# Patient Record
Sex: Male | Born: 1975 | Race: Black or African American | Hispanic: No | Smoking: Current every day smoker
Health system: Southern US, Community
[De-identification: ages and names within clinical notes are randomized; demographics above are authoritative.]

## PROBLEM LIST (undated history)

## (undated) DIAGNOSIS — F418 Other specified anxiety disorders: Secondary | ICD-10-CM

## (undated) DIAGNOSIS — M549 Dorsalgia, unspecified: Secondary | ICD-10-CM

## (undated) DIAGNOSIS — F4310 Post-traumatic stress disorder, unspecified: Secondary | ICD-10-CM

## (undated) DIAGNOSIS — R569 Unspecified convulsions: Secondary | ICD-10-CM

## (undated) DIAGNOSIS — L309 Dermatitis, unspecified: Secondary | ICD-10-CM

## (undated) DIAGNOSIS — I10 Essential (primary) hypertension: Secondary | ICD-10-CM

## (undated) HISTORY — PX: KNEE SURGERY: SHX244

---

## 2012-11-14 ENCOUNTER — Emergency Department (HOSPITAL_BASED_OUTPATIENT_CLINIC_OR_DEPARTMENT_OTHER)
Admission: EM | Admit: 2012-11-14 | Discharge: 2012-11-14 | Disposition: A | Discharge status: Home / Self Care | Payer: Self-pay | Attending: Emergency Medicine | Admitting: Emergency Medicine

## 2012-11-14 ENCOUNTER — Encounter (HOSPITAL_BASED_OUTPATIENT_CLINIC_OR_DEPARTMENT_OTHER): Payer: Self-pay | Admitting: *Deleted

## 2012-11-14 DIAGNOSIS — F17200 Nicotine dependence, unspecified, uncomplicated: Secondary | ICD-10-CM | POA: Insufficient documentation

## 2012-11-14 DIAGNOSIS — IMO0002 Reserved for concepts with insufficient information to code with codable children: Secondary | ICD-10-CM | POA: Insufficient documentation

## 2012-11-14 DIAGNOSIS — Y939 Activity, unspecified: Secondary | ICD-10-CM | POA: Insufficient documentation

## 2012-11-14 DIAGNOSIS — Y929 Unspecified place or not applicable: Secondary | ICD-10-CM | POA: Insufficient documentation
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
## 2012-11-17 NOTE — ED Provider Notes (Signed)
Medical screening examination/treatment/procedure(s) were performed by non-physician practitioner and as supervising physician I was immediately available for consultation/collaboration.   Shelda Jakes, MD 11/17/12 [phone]

## 2013-10-11 ENCOUNTER — Encounter (HOSPITAL_BASED_OUTPATIENT_CLINIC_OR_DEPARTMENT_OTHER): Payer: Self-pay | Admitting: Emergency Medicine

## 2013-10-11 ENCOUNTER — Emergency Department (HOSPITAL_BASED_OUTPATIENT_CLINIC_OR_DEPARTMENT_OTHER): Payer: Self-pay

## 2013-10-11 ENCOUNTER — Emergency Department (HOSPITAL_BASED_OUTPATIENT_CLINIC_OR_DEPARTMENT_OTHER)
Admission: EM | Admit: 2013-10-11 | Discharge: 2013-10-11 | Disposition: A | Discharge status: Home / Self Care | Payer: Self-pay | Attending: Emergency Medicine | Admitting: Emergency Medicine

## 2013-10-11 DIAGNOSIS — M545 Low back pain, unspecified: Secondary | ICD-10-CM | POA: Insufficient documentation

## 2013-10-11 DIAGNOSIS — R292 Abnormal reflex: Secondary | ICD-10-CM | POA: Insufficient documentation

## 2013-10-11 DIAGNOSIS — R509 Fever, unspecified: Secondary | ICD-10-CM | POA: Insufficient documentation

## 2013-10-11 DIAGNOSIS — R109 Unspecified abdominal pain: Secondary | ICD-10-CM | POA: Insufficient documentation

## 2013-10-11 DIAGNOSIS — R112 Nausea with vomiting, unspecified: Secondary | ICD-10-CM | POA: Insufficient documentation

## 2013-10-11 DIAGNOSIS — K5900 Constipation, unspecified: Secondary | ICD-10-CM | POA: Insufficient documentation

## 2013-10-11 DIAGNOSIS — R350 Frequency of micturition: Secondary | ICD-10-CM | POA: Insufficient documentation

## 2013-10-11 DIAGNOSIS — G8911 Acute pain due to trauma: Secondary | ICD-10-CM | POA: Insufficient documentation

## 2013-10-11 DIAGNOSIS — S39012A Strain of muscle, fascia and tendon of lower back, initial encounter: Secondary | ICD-10-CM

## 2013-10-11 DIAGNOSIS — R3915 Urgency of urination: Secondary | ICD-10-CM | POA: Insufficient documentation

## 2013-10-11 DIAGNOSIS — M6281 Muscle weakness (generalized): Secondary | ICD-10-CM | POA: Insufficient documentation

## 2013-10-11 DIAGNOSIS — R259 Unspecified abnormal involuntary movements: Secondary | ICD-10-CM | POA: Insufficient documentation

## 2013-10-11 DIAGNOSIS — F17200 Nicotine dependence, unspecified, uncomplicated: Secondary | ICD-10-CM | POA: Insufficient documentation

## 2013-10-11 DIAGNOSIS — R197 Diarrhea, unspecified: Secondary | ICD-10-CM | POA: Insufficient documentation

## 2013-10-11 MED ORDER — NAPROXEN 500 MG PO TABS: 500.0000 mg | ORAL_TABLET | Freq: Two times a day (BID) | ORAL | Status: DC

## 2013-10-11 MED ORDER — DIAZEPAM 5 MG PO TABS
5.0000 mg | ORAL_TABLET | Freq: Once | ORAL | Status: AC
  Administered 2013-10-11: 5 mg via ORAL
  Filled 2013-10-11: qty 1

## 2013-10-11 MED ORDER — KETOROLAC TROMETHAMINE 60 MG/2ML IM SOLN
60.0000 mg | Freq: Once | INTRAMUSCULAR | Status: AC
  Administered 2013-10-11: 60 mg via INTRAMUSCULAR
  Filled 2013-10-11: qty 2

## 2013-10-11 MED ORDER — DIAZEPAM 5 MG PO TABS: 5.0000 mg | ORAL_TABLET | Freq: Four times a day (QID) | ORAL | Status: DC | PRN

## 2013-10-11 NOTE — ED Notes (Signed)
Pt c/o chronic back pain x 1 year by injury increased pain this am
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
## 2013-10-11 NOTE — ED Notes (Signed)
Patient stated that he could not wait for an MRI and spoke with MD and was given a prescription for back muscle pain.  Patient given referral for MD for continued back pain

## 2013-10-16 ENCOUNTER — Ambulatory Visit (INDEPENDENT_AMBULATORY_CARE_PROVIDER_SITE_OTHER): Payer: Self-pay | Admitting: Family Medicine

## 2013-10-16 ENCOUNTER — Encounter: Payer: Self-pay | Admitting: Family Medicine

## 2013-10-16 VITALS — BP 145/102 | HR 112 | Ht 75.0 in | Wt 170.0 lb

## 2013-10-16 DIAGNOSIS — G8929 Other chronic pain: Secondary | ICD-10-CM

## 2013-10-16 DIAGNOSIS — M549 Dorsalgia, unspecified: Secondary | ICD-10-CM

## 2013-10-16 MED ORDER — PREDNISONE (PAK) 10 MG PO TABS: ORAL_TABLET | ORAL | Status: DC

## 2013-10-16 MED ORDER — TRAMADOL HCL 50 MG PO TABS: 50.0000 mg | ORAL_TABLET | Freq: Four times a day (QID) | ORAL | Status: DC | PRN

## 2013-10-16 MED ORDER — NAPROXEN 500 MG PO TABS: 500.0000 mg | ORAL_TABLET | Freq: Two times a day (BID) | ORAL | Status: DC

## 2013-10-16 MED ORDER — CYCLOBENZAPRINE HCL 10 MG PO TABS: 10.0000 mg | ORAL_TABLET | Freq: Three times a day (TID) | ORAL | Status: DC | PRN

## 2013-10-16 NOTE — Patient Instructions (Signed)
At minimum you have thoracic, cervical, lumbar strains. A prednisone dose pack is the best option for immediate relief and may be prescribed. Day after finishing prednisone start naproxen twice a day with food for pain and inflammation. Tramadol as needed for severe pain (no driving on this medicine). Flexeril as needed for muscle spasms (no driving on this medicine if it makes you sleepy). Stay as active as possible. Follow up with me as soon as you get cone coverage. Strengthening of low back muscles, abdominal musculature are key for long term pain relief. If not improving, will consider further imaging (MRI), physical therapy.

## 2013-10-19 ENCOUNTER — Encounter: Payer: Self-pay | Admitting: Family Medicine
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
## 2013-10-27 ENCOUNTER — Encounter: Payer: Self-pay | Admitting: *Deleted

## 2013-10-28 DIAGNOSIS — G8929 Other chronic pain: Secondary | ICD-10-CM | POA: Insufficient documentation

## 2013-10-28 NOTE — Assessment & Plan Note (Signed)
patient reports pain throughout entire back with unusual concurrent symptomatology (bilateral hand numbness, left arm jerking, left foot locking up, jaw tightness) that does not fit with any nerve distribution that could be caused by a disc herniation.  Suspect he has muscle strains of cervical through lumbar back based on exam.  Has not done physical therapy for this - advised to call us when cone coverage goes through andthis would be first step.  Prednisone dose pack then naproxen.  Tramadol, flexeril as needed.  No refills on the tramadol.  Can consider MRIs but distribution would not suggest this to be cause of his pain.

## 2015-04-07 ENCOUNTER — Encounter (HOSPITAL_BASED_OUTPATIENT_CLINIC_OR_DEPARTMENT_OTHER): Payer: Self-pay | Admitting: *Deleted

## 2015-04-07 ENCOUNTER — Emergency Department (HOSPITAL_BASED_OUTPATIENT_CLINIC_OR_DEPARTMENT_OTHER): Payer: Self-pay

## 2015-04-07 ENCOUNTER — Inpatient Hospital Stay (HOSPITAL_BASED_OUTPATIENT_CLINIC_OR_DEPARTMENT_OTHER)
Admission: EM | Admit: 2015-04-07 | Discharge: 2015-04-10 | DRG: 603 | Disposition: A | Discharge status: Home / Self Care | Payer: Self-pay | Attending: Internal Medicine | Admitting: Internal Medicine

## 2015-04-07 DIAGNOSIS — L309 Dermatitis, unspecified: Secondary | ICD-10-CM | POA: Diagnosis present

## 2015-04-07 DIAGNOSIS — R52 Pain, unspecified: Secondary | ICD-10-CM

## 2015-04-07 DIAGNOSIS — F17210 Nicotine dependence, cigarettes, uncomplicated: Secondary | ICD-10-CM | POA: Diagnosis present

## 2015-04-07 DIAGNOSIS — M7989 Other specified soft tissue disorders: Secondary | ICD-10-CM | POA: Diagnosis present

## 2015-04-07 DIAGNOSIS — R079 Chest pain, unspecified: Secondary | ICD-10-CM

## 2015-04-07 DIAGNOSIS — L0390 Cellulitis, unspecified: Secondary | ICD-10-CM | POA: Insufficient documentation

## 2015-04-07 DIAGNOSIS — Z79899 Other long term (current) drug therapy: Secondary | ICD-10-CM

## 2015-04-07 DIAGNOSIS — R609 Edema, unspecified: Secondary | ICD-10-CM

## 2015-04-07 DIAGNOSIS — R030 Elevated blood-pressure reading, without diagnosis of hypertension: Secondary | ICD-10-CM | POA: Diagnosis present

## 2015-04-07 DIAGNOSIS — L03114 Cellulitis of left upper limb: Principal | ICD-10-CM

## 2015-04-07 DIAGNOSIS — Z872 Personal history of diseases of the skin and subcutaneous tissue: Secondary | ICD-10-CM

## 2015-04-07 HISTORY — DX: Dorsalgia, unspecified: M54.9

## 2015-04-07 HISTORY — DX: Dermatitis, unspecified: L30.9

## 2015-04-07 LAB — CBC WITH DIFFERENTIAL/PLATELET
Basophils Absolute: 0 10*3/uL (ref 0.0–0.1)
Basophils Relative: 0 % (ref 0–1)
EOS ABS: 0.2 10*3/uL (ref 0.0–0.7)
EOS PCT: 3 % (ref 0–5)
HCT: 41.4 % (ref 39.0–52.0)
Hemoglobin: 13.5 g/dL (ref 13.0–17.0)
Lymphocytes Relative: 33 % (ref 12–46)
Lymphs Abs: 2.7 10*3/uL (ref 0.7–4.0)
MCH: 30.2 pg (ref 26.0–34.0)
MCHC: 32.6 g/dL (ref 30.0–36.0)
MCV: 92.6 fL (ref 78.0–100.0)
Monocytes Absolute: 0.7 10*3/uL (ref 0.1–1.0)
Monocytes Relative: 8 % (ref 3–12)
NEUTROS PCT: 56 % (ref 43–77)
Neutro Abs: 4.5 10*3/uL (ref 1.7–7.7)
Platelets: 315 10*3/uL (ref 150–400)
RBC: 4.47 MIL/uL (ref 4.22–5.81)
RDW: 13.8 % (ref 11.5–15.5)
WBC: 8.1 10*3/uL (ref 4.0–10.5)

## 2015-04-07 LAB — BASIC METABOLIC PANEL
ANION GAP: 9 (ref 5–15)
BUN: 6 mg/dL (ref 6–20)
CO2: 27 mmol/L (ref 22–32)
Calcium: 8.5 mg/dL — ABNORMAL LOW (ref 8.9–10.3)
Chloride: 103 mmol/L (ref 101–111)
Creatinine, Ser: 0.75 mg/dL (ref 0.61–1.24)
GFR calc Af Amer: >60 mL/min (ref >60)
GFR calc non Af Amer: >60 mL/min (ref >60)
GLUCOSE: 97 mg/dL (ref 65–99)
Potassium: 3.8 mmol/L (ref 3.5–5.1)
Sodium: 139 mmol/L (ref 135–145)

## 2015-04-07 LAB — PROTIME-INR
INR: 0.98 (ref 0.00–1.49)
PROTHROMBIN TIME: 13.2 s (ref 11.6–15.2)

## 2015-04-07 LAB — D-DIMER, QUANTITATIVE (NOT AT ARMC): D DIMER QUANT: 1.81 ug{FEU}/mL — AB (ref 0.00–0.48)

## 2015-04-07 LAB — I-STAT CG4 LACTIC ACID, ED: Lactic Acid, Venous: 1.37 mmol/L (ref 0.5–2.0)

## 2015-04-07 LAB — ETHANOL: Alcohol, Ethyl (B): 270 mg/dL — ABNORMAL HIGH (ref <5)

## 2015-04-07 MED ORDER — SODIUM CHLORIDE 0.9 % IV SOLN
Freq: Once | INTRAVENOUS | Status: AC
  Administered 2015-04-08: 03:00:00 via INTRAVENOUS

## 2015-04-07 MED ORDER — SODIUM CHLORIDE 0.9 % IV BOLUS (SEPSIS)
500.0000 mL | Freq: Once | INTRAVENOUS | Status: AC
  Administered 2015-04-07: 500 mL via INTRAVENOUS

## 2015-04-07 MED ORDER — ONDANSETRON HCL 4 MG/2ML IJ SOLN
4.0000 mg | Freq: Once | INTRAMUSCULAR | Status: AC
  Administered 2015-04-07: 4 mg via INTRAVENOUS
  Filled 2015-04-07: qty 2

## 2015-04-07 MED ORDER — VANCOMYCIN HCL IN DEXTROSE 1-5 GM/200ML-% IV SOLN
1000.0000 mg | Freq: Three times a day (TID) | INTRAVENOUS | Status: DC
  Administered 2015-04-07 – 2015-04-10 (×7): 1000 mg via INTRAVENOUS
  Filled 2015-04-07 (×11): qty 200

## 2015-04-07 MED ORDER — MORPHINE SULFATE 4 MG/ML IJ SOLN
4.0000 mg | INTRAMUSCULAR | Status: DC | PRN
  Administered 2015-04-07 – 2015-04-08 (×2): 4 mg via INTRAVENOUS
  Filled 2015-04-07 (×2): qty 1

## 2015-04-07 MED ORDER — IOHEXOL 350 MG/ML SOLN
100.0000 mL | Freq: Once | INTRAVENOUS | Status: AC | PRN
  Administered 2015-04-07: 100 mL via INTRAVENOUS

## 2015-04-07 NOTE — ED Notes (Signed)
C/o left arm swelling and painful x several days  Denies inj

## 2015-04-07 NOTE — ED Notes (Signed)
Pt c/o left arm pain and swelling x2-3 days. Pt also c/o right, middle back pain.

## 2015-04-07 NOTE — ED Provider Notes (Signed)
CSN: [id number]     Arrival date & time 04/07/15  1951 History  This chart was scribed for Rolland PorterMark Dorthula Bier, MD by Octavia HeirArianna Nassar, ED Scribe. This patient was seen in room MH01/MH01 and the patient's care was started at 8:10 PM.    Chief Complaint  Patient presents with  . Arm Pain     The history is provided by the patient. No language interpreter was used.    HPI Comments: Cory Reese is a 39 y.o. male who has a PMHx of eczema presents to the Emergency Department complaining of constant, gradual worsening left arm pain onset 3 days ago. He has associated pain in his left armpit. Pt reports his left arm is swollen and very painful. He notes the pain started in his hands then radiated up to his arm. Pt also notes having associated constant, gradual worsening chest pain onset 3 weeks ago with difficulty breathing. Pt is a smoker and just started drinking again previously.   Past Medical History  Diagnosis Date  . Eczema   . Back pain    Past Surgical History  Procedure Laterality Date  . Knee surgery     Family History  Problem Relation Age of Onset  . Hypertension Mother   . Hypertension Father   . Hyperlipidemia Neg Hx   . Heart attack Neg Hx   . Diabetes Neg Hx   . Sudden death Neg Hx    History  Substance Use Topics  . Smoking status: Current Every Day Smoker -- 1.00 packs/day    Types: Cigarettes  . Smokeless tobacco: Not on file  . Alcohol Use: Yes     Comment: 5th per day of vodka    Review of Systems  Constitutional: Negative for fever, chills, diaphoresis, appetite change and fatigue.  HENT: Negative for mouth sores, sore throat and trouble swallowing.   Eyes: Negative for visual disturbance.  Respiratory: Positive for shortness of breath. Negative for cough, chest tightness and wheezing.   Cardiovascular: Negative for chest pain.  Gastrointestinal: Negative for nausea, vomiting, abdominal pain, diarrhea and abdominal distention.  Endocrine: Negative for  polydipsia, polyphagia and polyuria.  Genitourinary: Negative for dysuria, frequency and hematuria.  Musculoskeletal: Positive for arthralgias. Negative for gait problem.  Skin: Negative for color change, pallor and rash.  Neurological: Negative for dizziness, syncope, light-headedness and headaches.  Hematological: Does not bruise/bleed easily.  Psychiatric/Behavioral: Negative for behavioral problems and confusion.      Allergies  Review of patient's allergies indicates no known allergies.  Home Medications   Prior to Admission medications   Medication Sig Start Date End Date Taking? Authorizing Provider  cyclobenzaprine (FLEXERIL) 10 MG tablet Take 1 tablet (10 mg total) by mouth 3 (three) times daily as needed for muscle spasms. 10/16/13   Lenda KelpShane R Hudnall, MD  naproxen (NAPROSYN) 500 MG tablet Take 1 tablet (500 mg total) by mouth 2 (two) times daily. With food.  Start day AFTER finishing prednisone. 10/16/13   Lenda KelpShane R Hudnall, MD  predniSONE (STERAPRED UNI-PAK) 10 MG tablet 6 tabs po day 1, 5 tabs po day 2, 4 tabs po day 3, 3 tabs po day 4, 2 tabs po day 5, 1 tab po day 6 10/16/13   Lenda KelpShane R Hudnall, MD  traMADol (ULTRAM) 50 MG tablet Take 1 tablet (50 mg total) by mouth every 6 (six) hours as needed. 10/16/13   Lenda KelpShane R Hudnall, MD   Triage vitals: BP 136/93 mmHg  Pulse 112  Temp(Src) 98.6 F (  37 C) (Oral)  Resp 18  Ht  (1.905 m)  Wt 215 lb (97.523 kg)  BMI 26.87 kg/m2  SpO2 92% Physical Exam  Constitutional: He is oriented to person, place, and time. He appears well-developed and well-nourished. No distress.  HENT:  Head: Normocephalic.  Eyes: Conjunctivae are normal. Pupils are equal, round, and reactive to light. No scleral icterus.  Neck: Normal range of motion. Neck supple. No thyromegaly present.  Cardiovascular: Normal rate and regular rhythm.  Exam reveals no gallop and no friction rub.   No murmur heard. Pulmonary/Chest: Effort normal and breath sounds  normal. No respiratory distress. He has no wheezes. He has no rales.  Abdominal: Soft. Bowel sounds are normal. He exhibits no distension. There is no tenderness. There is no rebound.  Musculoskeletal: Normal range of motion.  Left arm is diffusely tender palpable courting in left arm  Neurological: He is alert and oriented to person, place, and time.  Skin: Skin is warm and dry. No rash noted.  Psychiatric: He has a normal mood and affect. His behavior is normal.  Nursing note and vitals reviewed.   ED Course  Procedures  DIAGNOSTIC STUDIES: Oxygen Saturation is 92% on RA, low by my interpretation.  COORDINATION OF CARE:  8:12 PM Discussed treatment plan which includes ultrasound of left arm and chest, blood work, CT scan with pt at bedside and pt agreed to plan.   Labs Review Labs Reviewed  BASIC METABOLIC PANEL - Abnormal; Notable for the following:    Calcium 8.5 (*)    All other components within normal limits  D-DIMER, QUANTITATIVE (NOT AT [facility]) - Abnormal; Notable for the following:    D-Dimer, Quant 1.81 (*)    All other components within normal limits  ETHANOL - Abnormal; Notable for the following:    Alcohol, Ethyl (B) 270 (*)    All other components within normal limits  URINALYSIS, ROUTINE W REFLEX MICROSCOPIC (NOT AT [facility]) - Abnormal; Notable for the following:    Specific Gravity, Urine 1.038 (*)    All other components within normal limits  CK - Abnormal; Notable for the following:    Total CK 436 (*)    All other components within normal limits  CBC WITH DIFFERENTIAL/PLATELET  PROTIME-INR  SEDIMENTATION RATE  C-REACTIVE PROTEIN  I-STAT CG4 LACTIC ACID, ED    Imaging Review Ct Angio Chest Pe W/cm &/or Wo Cm  04/07/2015   CLINICAL DATA:  LEFT arm pain and swelling for 2-3 days.  EXAM: CT ANGIOGRAPHY CHEST WITH CONTRAST  TECHNIQUE: Multidetector CT imaging of the chest was performed using the standard protocol during bolus administration of intravenous  contrast. Multiplanar CT image reconstructions and MIPs were obtained to evaluate the vascular anatomy.  CONTRAST:  OMNIPAQUE IOHEXOL 350 MG/ML SOLN  COMPARISON:  Cervical spine radiograph 11/15/2012.  FINDINGS: Mediastinum: No filling defects are noted within the pulmonary arterial tree to suggest pulmonary emboli. Heart size is normal. No pericardial fluid, thickening or calcification. No acute abnormality of the thoracic aorta or other great vessels of the mediastinum. BILATERAL enlarged axillary lymph nodes without visible vascular compromise. These measure up to 11 mm short axis. No pathologically enlarged mediastinal or hilar lymph nodes. The esophagus is normal in appearance.  Lungs/Pleura: No consolidative airspace disease. No pleural effusions. No suspicious appearing pulmonary nodules or masses.  Upper Abdomen: Visualized portions of the upper abdomen are unremarkable.  Musculoskeletal: No aggressive appearing lytic or blastic lesions are noted in the visualized portions  of the skeleton. No evidence for cervical ribs.  Review of the MIP images confirms the above findings.  IMPRESSION: BILATERAL axillary adenopathy up to 11 mm short axis. Otherwise unremarkable CT chest.   Electronically Signed   By: Davonna Belling M.D.   On: 04/07/2015 21:39     EKG Interpretation None      MDM   Final diagnoses:  Chest pain  Cellulitis of left upper extremity    Negative CT angiogram. Labs obtained. Given IV vancomycin. Discussed with patient that he may have DVT or SVT in the arm. However think his primary insult at this point is a cellulitis which is impressive. Discussed case with Dr. Allena Katz. Additional orders were discussed. Plan will be transferred for admission to [facility].  I personally performed the services described in this documentation, which was scribed in my presence. The recorded information has been reviewed and is accurate.   Rolland Porter, MD 04/08/15 0010

## 2015-04-07 NOTE — ED Notes (Signed)
MD at bedside. 

## 2015-04-07 NOTE — Progress Notes (Addendum)
ANTIBIOTIC CONSULT NOTE - INITIAL  Pharmacy Consult for Vancomycin Indication: rule out pneumonia  No Known Allergies  Patient Measurements: Height: 6\' 3"  (190.5 cm) Weight: 215 lb (97.523 kg) IBW/kg (Calculated) : 84.5 Adjusted Body Weight:   Vital Signs: Temp: 98.6 F (37 C) (06/08 2000) Temp Source: Oral (06/08 2000) BP: 136/93 mmHg (06/08 2000) Pulse Rate: 104 (06/08 2044) Intake/Output from previous day:   Intake/Output from this shift:    Labs:  Recent Labs  04/07/15 2030  WBC 8.1  HGB 13.5  PLT 315  CREATININE 0.75   Estimated Creatinine Clearance: 148.2 mL/min (by C-G formula based on Cr of 0.75). No results for input(s): VANCOTROUGH, VANCOPEAK, VANCORANDOM, GENTTROUGH, GENTPEAK, GENTRANDOM, TOBRATROUGH, TOBRAPEAK, TOBRARND, AMIKACINPEAK, AMIKACINTROU, AMIKACIN in the last 72 hours.   Microbiology: No results found for this or any previous visit (from the past 720 hour(s)).  Medical History: Past Medical History  Diagnosis Date  . Eczema   . Back pain     Medications:   (Not in a hospital admission) Scheduled:   Infusions:  . sodium chloride     Assessment: 38yo male presents to [facility] with constant, gradual worsening L arm pain. Pharmacy is consulted to dose vancomycin for suspected pneumonia. Pt is afebrile, WBC 8.1, sCr 0.75, LA 1.4.  Goal of Therapy:  Vancomycin trough level 15-20 mcg/ml  Plan:  Vancomycin 1g IV q8h Expected duration 7 days with resolution of temperature and/or normalization of WBC Measure antibiotic drug levels at steady state Follow up culture results, renal function, and clinical course  Arlean HoppingCorey M. Newman PiesBall, PharmD Clinical Pharmacist Pager [phone] 04/07/2015,10:15 PM    ADDENDUM: Indication for ABX to change to cellulitis, lower vanc trough goal of 10-15, and to broaden coverage with Zosyn.  Vanc dose remains appropriate; will add Zosyn 3.375g IV Q8H and monitor CBC and Cx.  Vernard GamblesVeronda Dagon Budai, PharmD, BCPS 04/08/2015 3:53  AM

## 2015-04-08 ENCOUNTER — Inpatient Hospital Stay (HOSPITAL_COMMUNITY): Payer: Self-pay

## 2015-04-08 ENCOUNTER — Encounter (HOSPITAL_COMMUNITY): Payer: Self-pay | Admitting: Internal Medicine

## 2015-04-08 DIAGNOSIS — L03114 Cellulitis of left upper limb: Principal | ICD-10-CM

## 2015-04-08 DIAGNOSIS — Z872 Personal history of diseases of the skin and subcutaneous tissue: Secondary | ICD-10-CM

## 2015-04-08 DIAGNOSIS — M7989 Other specified soft tissue disorders: Secondary | ICD-10-CM | POA: Diagnosis present

## 2015-04-08 LAB — URINALYSIS, ROUTINE W REFLEX MICROSCOPIC
Bilirubin Urine: NEGATIVE
Glucose, UA: NEGATIVE mg/dL
HGB URINE DIPSTICK: NEGATIVE
KETONES UR: NEGATIVE mg/dL
Leukocytes, UA: NEGATIVE
NITRITE: NEGATIVE
Protein, ur: NEGATIVE mg/dL
Specific Gravity, Urine: 1.038 — ABNORMAL HIGH (ref 1.005–1.030)
UROBILINOGEN UA: 0.2 mg/dL (ref 0.0–1.0)
pH: 7 (ref 5.0–8.0)

## 2015-04-08 LAB — CBC WITH DIFFERENTIAL/PLATELET
BASOS ABS: 0 10*3/uL (ref 0.0–0.1)
Basophils Relative: 0 % (ref 0–1)
Eosinophils Absolute: 0.3 10*3/uL (ref 0.0–0.7)
Eosinophils Relative: 4 % (ref 0–5)
HCT: 38.3 % — ABNORMAL LOW (ref 39.0–52.0)
Hemoglobin: 12.5 g/dL — ABNORMAL LOW (ref 13.0–17.0)
LYMPHS PCT: 24 % (ref 12–46)
Lymphs Abs: 1.9 10*3/uL (ref 0.7–4.0)
MCH: 30.6 pg (ref 26.0–34.0)
MCHC: 32.6 g/dL (ref 30.0–36.0)
MCV: 93.9 fL (ref 78.0–100.0)
MONO ABS: 0.9 10*3/uL (ref 0.1–1.0)
Monocytes Relative: 11 % (ref 3–12)
NEUTROS PCT: 61 % (ref 43–77)
Neutro Abs: 5 10*3/uL (ref 1.7–7.7)
Platelets: 264 10*3/uL (ref 150–400)
RBC: 4.08 MIL/uL — ABNORMAL LOW (ref 4.22–5.81)
RDW: 14.4 % (ref 11.5–15.5)
WBC: 8.1 10*3/uL (ref 4.0–10.5)

## 2015-04-08 LAB — COMPREHENSIVE METABOLIC PANEL
ALT: 20 U/L (ref 17–63)
ANION GAP: 12 (ref 5–15)
AST: 23 U/L (ref 15–41)
Albumin: 3.2 g/dL — ABNORMAL LOW (ref 3.5–5.0)
Alkaline Phosphatase: 64 U/L (ref 38–126)
BUN: 5 mg/dL — ABNORMAL LOW (ref 6–20)
CALCIUM: 8.4 mg/dL — AB (ref 8.9–10.3)
CO2: 25 mmol/L (ref 22–32)
Chloride: 104 mmol/L (ref 101–111)
Creatinine, Ser: 0.88 mg/dL (ref 0.61–1.24)
GFR calc non Af Amer: >60 mL/min (ref >60)
Glucose, Bld: 91 mg/dL (ref 65–99)
POTASSIUM: 3.9 mmol/L (ref 3.5–5.1)
Sodium: 141 mmol/L (ref 135–145)
TOTAL PROTEIN: 6.7 g/dL (ref 6.5–8.1)
Total Bilirubin: 0.5 mg/dL (ref 0.3–1.2)

## 2015-04-08 LAB — C-REACTIVE PROTEIN
CRP: 1.8 mg/dL — ABNORMAL HIGH (ref <1.0)
CRP: 1.6 mg/dL — ABNORMAL HIGH (ref <1.0)

## 2015-04-08 LAB — RAPID URINE DRUG SCREEN, HOSP PERFORMED
Amphetamines: NOT DETECTED
BARBITURATES: NOT DETECTED
BENZODIAZEPINES: NOT DETECTED
COCAINE: NOT DETECTED
OPIATES: POSITIVE — AB
Tetrahydrocannabinol: NOT DETECTED

## 2015-04-08 LAB — SEDIMENTATION RATE
Sed Rate: 24 mm/hr — ABNORMAL HIGH (ref 0–16)
Sed Rate: 27 mm/hr — ABNORMAL HIGH (ref 0–16)

## 2015-04-08 LAB — CK
Total CK: 436 U/L — ABNORMAL HIGH (ref 49–397)
Total CK: 304 U/L (ref 49–397)

## 2015-04-08 LAB — TROPONIN I: Troponin I: <0.03 ng/mL (ref <0.031)

## 2015-04-08 MED ORDER — ACETAMINOPHEN 325 MG PO TABS: 650.0000 mg | ORAL_TABLET | Freq: Four times a day (QID) | ORAL | Status: DC | PRN

## 2015-04-08 MED ORDER — ONDANSETRON HCL 4 MG/2ML IJ SOLN: 4.0000 mg | Freq: Four times a day (QID) | INTRAMUSCULAR | Status: DC | PRN

## 2015-04-08 MED ORDER — PIPERACILLIN-TAZOBACTAM 3.375 G IVPB 30 MIN
3.3750 g | Freq: Once | INTRAVENOUS | Status: AC
  Administered 2015-04-08: 3.375 g via INTRAVENOUS
  Filled 2015-04-08: qty 50

## 2015-04-08 MED ORDER — HYDRALAZINE HCL 20 MG/ML IJ SOLN
5.0000 mg | INTRAMUSCULAR | Status: DC | PRN
  Filled 2015-04-08: qty 1

## 2015-04-08 MED ORDER — ACETAMINOPHEN 650 MG RE SUPP: 650.0000 mg | Freq: Four times a day (QID) | RECTAL | Status: DC | PRN

## 2015-04-08 MED ORDER — FOLIC ACID 1 MG PO TABS
1.0000 mg | ORAL_TABLET | Freq: Every day | ORAL | Status: DC
  Administered 2015-04-08 – 2015-04-10 (×3): 1 mg via ORAL
  Filled 2015-04-08 (×3): qty 1

## 2015-04-08 MED ORDER — ADULT MULTIVITAMIN W/MINERALS CH
1.0000 | ORAL_TABLET | Freq: Every day | ORAL | Status: DC
  Administered 2015-04-08 – 2015-04-10 (×3): 1 via ORAL
  Filled 2015-04-08 (×3): qty 1

## 2015-04-08 MED ORDER — LORAZEPAM 1 MG PO TABS: 1.0000 mg | ORAL_TABLET | Freq: Four times a day (QID) | ORAL | Status: DC | PRN

## 2015-04-08 MED ORDER — SODIUM CHLORIDE 0.9 % IV SOLN
INTRAVENOUS | Status: AC
  Administered 2015-04-08 – 2015-04-09 (×2): via INTRAVENOUS

## 2015-04-08 MED ORDER — HYDROCERIN EX CREA
TOPICAL_CREAM | Freq: Two times a day (BID) | CUTANEOUS | Status: DC
  Administered 2015-04-09 – 2015-04-10 (×4): via TOPICAL
  Filled 2015-04-08: qty 113

## 2015-04-08 MED ORDER — HYDROMORPHONE HCL 1 MG/ML IJ SOLN
1.0000 mg | INTRAMUSCULAR | Status: DC | PRN
  Administered 2015-04-08 – 2015-04-09 (×3): 1 mg via INTRAVENOUS
  Filled 2015-04-08 (×3): qty 1

## 2015-04-08 MED ORDER — ONDANSETRON HCL 4 MG PO TABS: 4.0000 mg | ORAL_TABLET | Freq: Four times a day (QID) | ORAL | Status: DC | PRN

## 2015-04-08 MED ORDER — PIPERACILLIN-TAZOBACTAM 3.375 G IVPB
3.3750 g | Freq: Three times a day (TID) | INTRAVENOUS | Status: DC
  Administered 2015-04-08 – 2015-04-10 (×6): 3.375 g via INTRAVENOUS
  Filled 2015-04-08 (×9): qty 50

## 2015-04-08 MED ORDER — THIAMINE HCL 100 MG/ML IJ SOLN
100.0000 mg | Freq: Every day | INTRAMUSCULAR | Status: DC
  Filled 2015-04-08 (×2): qty 1

## 2015-04-08 MED ORDER — LORAZEPAM 2 MG/ML IJ SOLN: 1.0000 mg | Freq: Four times a day (QID) | INTRAMUSCULAR | Status: DC | PRN

## 2015-04-08 MED ORDER — VITAMIN B-1 100 MG PO TABS
100.0000 mg | ORAL_TABLET | Freq: Every day | ORAL | Status: DC
  Administered 2015-04-08 – 2015-04-10 (×3): 100 mg via ORAL
  Filled 2015-04-08 (×3): qty 1

## 2015-04-08 NOTE — Consult Note (Signed)
ORTHOPAEDIC CONSULTATION  REQUESTING PHYSICIAN: Florencia Reasons, MD  Chief Complaint: Left arm pain and swelling  HPI: Cory Reese is a 39 y.o. male who complains of 3 days of worsening L arm pain. It started in his forearm and has progressed to his arm and elbow. He denies any break in the skin or trauma to the extremity. He had some chest pain previously but this has resolved. He reports normal sensation currently but has had off and on tingling his his lesser fingers.   Past Medical History  Diagnosis Date  . Eczema   . Back pain    Past Surgical History  Procedure Laterality Date  . Knee surgery     History   Social History  . Marital Status: Single    Spouse Name: N/A  . Number of Children: N/A  . Years of Education: N/A   Social History Main Topics  . Smoking status: Current Every Day Smoker -- 1.00 packs/day    Types: Cigarettes  . Smokeless tobacco: Not on file  . Alcohol Use: Yes     Comment: 5th per day of vodka  . Drug Use: No  . Sexual Activity: Not on file   Other Topics Concern  . None   Social History Narrative   Family History  Problem Relation Age of Onset  . Hypertension Mother   . Hypertension Father   . Hyperlipidemia Neg Hx   . Heart attack Neg Hx   . Diabetes Neg Hx   . Sudden death Neg Hx    No Known Allergies Prior to Admission medications   Medication Sig Start Date End Date Taking? Authorizing Provider  cyclobenzaprine (FLEXERIL) 10 MG tablet Take 1 tablet (10 mg total) by mouth 3 (three) times daily as needed for muscle spasms. 10/16/13   Dene Gentry, MD  naproxen (NAPROSYN) 500 MG tablet Take 1 tablet (500 mg total) by mouth 2 (two) times daily. With food.  Start day AFTER finishing prednisone. 10/16/13   Dene Gentry, MD  predniSONE (STERAPRED UNI-PAK) 10 MG tablet 6 tabs po day 1, 5 tabs po day 2, 4 tabs po day 3, 3 tabs po day 4, 2 tabs po day 5, 1 tab po day 6 10/16/13   Dene Gentry, MD  traMADol (ULTRAM) 50 MG  tablet Take 1 tablet (50 mg total) by mouth every 6 (six) hours as needed. 10/16/13   Dene Gentry, MD   Dg Elbow 2 Views Left  04/08/2015   CLINICAL DATA:  Pain and swelling in the forearm, nontraumatic.  EXAM: LEFT ELBOW - 2 VIEW  COMPARISON:  None.  FINDINGS: There is no evidence of fracture, dislocation, or joint effusion. There is no evidence of arthropathy or other focal bone abnormality. Soft tissues are unremarkable.  IMPRESSION: Negative.   Electronically Signed   By: Andreas Newport M.D.   On: 04/08/2015 04:45   Dg Forearm Left  04/08/2015   CLINICAL DATA:  Nontraumatic pain and swelling in the forearm  EXAM: LEFT FOREARM - 2 VIEW  COMPARISON:  None.  FINDINGS: There is no evidence of fracture or other focal bone lesions. Soft tissues are unremarkable.  IMPRESSION: Negative.   Electronically Signed   By: Andreas Newport M.D.   On: 04/08/2015 04:45   Ct Angio Chest Pe W/cm &/or Wo Cm  04/07/2015   CLINICAL DATA:  LEFT arm pain and swelling for 2-3 days.  EXAM: CT ANGIOGRAPHY CHEST WITH CONTRAST  TECHNIQUE:  Multidetector CT imaging of the chest was performed using the standard protocol during bolus administration of intravenous contrast. Multiplanar CT image reconstructions and MIPs were obtained to evaluate the vascular anatomy.  CONTRAST:  100mL OMNIPAQUE IOHEXOL 350 MG/ML SOLN  COMPARISON:  Cervical spine radiograph 11/15/2012.  FINDINGS: Mediastinum: No filling defects are noted within the pulmonary arterial tree to suggest pulmonary emboli. Heart size is normal. No pericardial fluid, thickening or calcification. No acute abnormality of the thoracic aorta or other great vessels of the mediastinum. BILATERAL enlarged axillary lymph nodes without visible vascular compromise. These measure up to 11 mm short axis. No pathologically enlarged mediastinal or hilar lymph nodes. The esophagus is normal in appearance.  Lungs/Pleura: No consolidative airspace disease. No pleural effusions. No  suspicious appearing pulmonary nodules or masses.  Upper Abdomen: Visualized portions of the upper abdomen are unremarkable.  Musculoskeletal: No aggressive appearing lytic or blastic lesions are noted in the visualized portions of the skeleton. No evidence for cervical ribs.  Review of the MIP images confirms the above findings.  IMPRESSION: BILATERAL axillary adenopathy up to 11 mm short axis. Otherwise unremarkable CT chest.   Electronically Signed   By: John  Curnes M.D.   On: 04/07/2015 21:39    Positive ROS: All other systems have been reviewed and were otherwise negative with the exception of those mentioned in the HPI and as above.  Labs cbc  Recent Labs  04/07/15 2030  WBC 8.1  HGB 13.5  HCT 41.4  PLT 315    Labs inflam No results for input(s): CRP in the last 72 hours.  Invalid input(s): ESR  Labs coag  Recent Labs  04/07/15 2030  INR 0.98     Recent Labs  04/07/15 2030  NA 139  K 3.8  CL 103  CO2 27  GLUCOSE 97  BUN 6  CREATININE 0.75  CALCIUM 8.5*    Physical Exam: Filed Vitals:   04/08/15 0500  BP: 145/84  Pulse: 96  Temp: 98.2 F (36.8 C)  Resp: 18   General: Alert, no acute distress Cardiovascular: No pedal edema Respiratory: No cyanosis, no use of accessory musculature GI: No organomegaly, abdomen is soft and non-tender Skin: No lesions in the area of chief complaint other than those listed below in MSK exam.  Neurologic: Sensation intact distally Psychiatric: Patient is competent for consent with normal mood and affect Lymphatic: No axillary or cervical lymphadenopathy  MUSCULOSKELETAL:  LUE: He has swelling at the medial forarm and arm. Painless ROM small arcs at the shoulder wrist and elbow. Currently NVI. He has some areas of TTP over his medial arm but no obvious fluctuance.  Other extremities are atraumatic with painless ROM and NVI.  Assessment: Arm pain and swellilng  Plan: Recommend Us to eval for DVT and or fluid  collection, should a fluid collection be found I would recommend VIR aspiration and possible surgical I&D if large enough.  Could get an MRI if it is not visible on US.  If negative I would continue to treat medically for cellulitis.  Abx per primary  I will follow.    MURPHY, TIMOTHY D, MD Cell [phone]   04/08/2015 7:18 AM     

## 2015-04-08 NOTE — Progress Notes (Signed)
Utilization review completed.  

## 2015-04-08 NOTE — H&P (Signed)
[facility] History and Physical  Cory Reese JXB:[id number] DOB: [birth year] DOA: 04/07/2015  Referring physician: Patient was transferred from [facility] [place]. PCP: No primary care provider on file. none. Specialists: None.  Chief Complaint: Left upper extremity pain and swelling.  HPI: Cory Reese is a 39 y.o. male with history of eczema started experiencing left upper arm swelling and pain over the last 3 days. Denies any trauma or insect bite. Patient's pain started at the bicep area and gradually worsened involving the elbow and forearm. Patient also initially had some left anterior chest wall pain which has resolved at this time. On exam patient has swollen left upper extremity involving the on and the forearm with difficulty to extend the elbow area. Patient's arm is mildly warm and has skin changes assistant with eczema. Patient's pulse felt well and bounding. Patient is able to make a fist with his left hand. Since patient had some hypoxia and tachycardia in the ER with some chest pain 2 days ago patient had a CT angiogram of the chest which was negative for any PE but showed bilateral axillary adenopathy. Patient has been admitted for further management.   Review of Systems: As presented in the history of presenting illness, rest negative.  Past Medical History  Diagnosis Date  . Eczema   . Back pain    Past Surgical History  Procedure Laterality Date  . Knee surgery     Social History:  reports that he has been smoking Cigarettes.  He has been smoking about 1.00 pack per day. He does not have any smokeless tobacco history on file. He reports that he drinks alcohol. He reports that he does not use illicit drugs. Where does patient live home. Can patient participate in ADLs? Yes.  No Known Allergies  Family History:  Family History  Problem Relation Age of Onset  . Hypertension Mother   . Hypertension Father   . Hyperlipidemia Neg Hx   . Heart attack  Neg Hx   . Diabetes Neg Hx   . Sudden death Neg Hx       Prior to Admission medications   Medication Sig Start Date End Date Taking? Authorizing Provider  cyclobenzaprine (FLEXERIL) 10 MG tablet Take 1 tablet (10 mg total) by mouth 3 (three) times daily as needed for muscle spasms. 10/16/13   Lenda Kelp, MD  naproxen (NAPROSYN) 500 MG tablet Take 1 tablet (500 mg total) by mouth 2 (two) times daily. With food.  Start day AFTER finishing prednisone. 10/16/13   Lenda Kelp, MD  predniSONE (STERAPRED UNI-PAK) 10 MG tablet 6 tabs po day 1, 5 tabs po day 2, 4 tabs po day 3, 3 tabs po day 4, 2 tabs po day 5, 1 tab po day 6 10/16/13   Lenda Kelp, MD  traMADol (ULTRAM) 50 MG tablet Take 1 tablet (50 mg total) by mouth every 6 (six) hours as needed. 10/16/13   Lenda Kelp, MD    Physical Exam: Filed Vitals:   04/07/15 2232 04/08/15 0034 04/08/15 0200 04/08/15 0222  BP: 155/90 137/71  161/84  Pulse: 110 104  112  Temp:  98.2 F (36.8 C)  98.5 F (36.9 C)  TempSrc:  Oral  Oral  Resp: Height:    (1.905 m)   Weight:   97.75 kg (215 lb 8 oz)   SpO2: 98% 95%  97%     General:  Well-built and nourished.  Eyes: Anicteric no pallor.  ENT: No discharge from ears eyes nose and mouth.  Neck: No mass felt.  Cardiovascular: S1 and S2 heard.  Respiratory: No rhonchi or crepitations.  Abdomen: Soft nontender bowel sounds present.  Skin: Skin changes seen on the extremities consistent with patient's known history of eczema. No active lesions seen.  Musculoskeletal: Left upper extremity is swollen and mildly warm from his left shoulder up to his mid forearm and patient is unable to extend his elbow.  Psychiatric: Appears normal.  Neurologic: Alert awake oriented to time place and person. Moves all extremities.  Labs on Admission:  Basic Metabolic Panel:  Recent Labs Lab 04/07/15 2030  NA 139  K 3.8  CL 103  CO2 27  GLUCOSE 97  BUN 6  CREATININE  0.75  CALCIUM 8.5*   Liver Function Tests: No results for input(s): AST, ALT, ALKPHOS, BILITOT, PROT, ALBUMIN in the last 168 hours. No results for input(s): LIPASE, AMYLASE in the last 168 hours. No results for input(s): AMMONIA in the last 168 hours. CBC:  Recent Labs Lab 04/07/15 2030  WBC 8.1  NEUTROABS 4.5  HGB 13.5  HCT 41.4  MCV 92.6  PLT 315   Cardiac Enzymes:  Recent Labs Lab 04/07/15 2030  CKTOTAL 436*    BNP (last 3 results) No results for input(s): BNP in the last 8760 hours.  ProBNP (last 3 results) No results for input(s): PROBNP in the last 8760 hours.  CBG: No results for input(s): GLUCAP in the last 168 hours.  Radiological Exams on Admission: Ct Angio Chest Pe W/cm &/or Wo Cm  04/07/2015   CLINICAL DATA:  LEFT arm pain and swelling for 2-3 days.  EXAM: CT ANGIOGRAPHY CHEST WITH CONTRAST  TECHNIQUE: Multidetector CT imaging of the chest was performed using the standard protocol during bolus administration of intravenous contrast. Multiplanar CT image reconstructions and MIPs were obtained to evaluate the vascular anatomy.  CONTRAST:  OMNIPAQUE IOHEXOL 350 MG/ML SOLN  COMPARISON:  Cervical spine radiograph 11/15/2012.  FINDINGS: Mediastinum: No filling defects are noted within the pulmonary arterial tree to suggest pulmonary emboli. Heart size is normal. No pericardial fluid, thickening or calcification. No acute abnormality of the thoracic aorta or other great vessels of the mediastinum. BILATERAL enlarged axillary lymph nodes without visible vascular compromise. These measure up to 11 mm short axis. No pathologically enlarged mediastinal or hilar lymph nodes. The esophagus is normal in appearance.  Lungs/Pleura: No consolidative airspace disease. No pleural effusions. No suspicious appearing pulmonary nodules or masses.  Upper Abdomen: Visualized portions of the upper abdomen are unremarkable.  Musculoskeletal: No aggressive appearing lytic or blastic  lesions are noted in the visualized portions of the skeleton. No evidence for cervical ribs.  Review of the MIP images confirms the above findings.  IMPRESSION: BILATERAL axillary adenopathy up to 11 mm short axis. Otherwise unremarkable CT chest.   Electronically Signed   By: Davonna Belling M.D.   On: 04/07/2015 21:39     Assessment/Plan Principal Problem:   Cellulitis of left upper extremity Active Problems:   History of eczema   Left upper extremity swelling   1. Left upper extremity swelling and pain, concerning for cellulitis - patient is not able to extend his elbow concerning for compartment syndrome the patient has good sensation at this time and at this time I have consulted on-call orthopedic surgeon Dr. Eulah Pont who will be seeing patient has consult. Dr. Eulah Pont has requested x-rays of the elbow and forearm.  At this time I have ordered vancomycin for possible cellulitis and Dopplers to rule out DVT. I have ordered to keep patient's left upper extremity elevated. Check sedimentation rate and CRP levels and CK levels. Check blood cultures. Patient's CK levels are mildly elevated. Continue with hydration and pain medication and for now I have kept patient nothing by mouth until seen by surgeon. 2. Elevated blood pressure - I have placed patient on when necessary IV hydralazine. Closely follow blood pressure trend. 3. History of eczema. 4. History of tobacco abuse - tobacco cessation counseling requested.  Patient states he drinks alcohol twice a week and last drink was yesterday. Usually drinks Vodka. I have placed patient on when necessary Ativan. Patient's troponin EKG and repeat CK levels are pending. Check urine drug screen.   DVT Prophylaxis SCDs for now.  Code Status: Full code.  Family Communication: Discussed with patient.  Disposition Plan: Admit to inpatient.    Mercia Dowe N. [facility] Pager [phone].  If 7PM-7AM, please contact  night-coverage [email or web address] Password [email or web address] 04/08/2015, 3:45 AM

## 2015-04-08 NOTE — Progress Notes (Addendum)
Patient seen and examined, vital stable, flat affect, chronic skin changes consistent with known history of eczema. Left elbow edema/tender/limited range of motion, distal pulse/sensation intact. [place] pending, consider mri if [place] unrevealing, continue ivf/abx, appreciate ortho input. [place] preliminary report: no DVT, discussed with orthopedic Dr. Eulah Pont , he advised to proceed with MRI. IR drain if +fluids collection. If pus, then may need Or tomorrow, can eat today. Mri left elbow and forearm ordered.

## 2015-04-08 NOTE — Progress Notes (Signed)
Subjective:  Patient reports pain as improving  Objective:   VITALS:   Filed Vitals:   04/08/15 0500 04/08/15 1055 04/08/15 1836 04/08/15 2133  BP: 145/84 157/90 149/86 133/87  Pulse: 96 88 75 84  Temp: 98.2 F (36.8 C) 98.4 F (36.9 C) 98.1 F (36.7 C) 98.8 F (37.1 C)  TempSrc: Oral Oral Oral Oral  Resp: Height:      Weight:    97.932 kg (215 lb 14.4 oz)  SpO2: 99% 100% 100% 98%    Physical Exam Stable arm appearance, no worsening NAD comfortable  LABS  Results for orders placed or performed during the hospital encounter of 04/07/15 (from the past 24 hour(s))  C-reactive protein     Status: Abnormal   Collection Time: 04/07/15 11:50 PM  Result Value Ref Range   CRP 1.6 (H) <1.0 mg/dL  Urinalysis, Routine w reflex microscopic (not at [facility])     Status: Abnormal   Collection Time: 04/07/15 11:58 PM  Result Value Ref Range   Color, Urine YELLOW YELLOW   APPearance CLEAR CLEAR   Specific Gravity, Urine 1.038 (H) 1.005 - 1.030   pH 7.0 5.0 - 8.0   Glucose, UA NEGATIVE NEGATIVE mg/dL   Hgb urine dipstick NEGATIVE NEGATIVE   Bilirubin Urine NEGATIVE NEGATIVE   Ketones, ur NEGATIVE NEGATIVE mg/dL   Protein, ur NEGATIVE NEGATIVE mg/dL   Urobilinogen, UA 0.2 0.0 - 1.0 mg/dL   Nitrite NEGATIVE NEGATIVE   Leukocytes, UA NEGATIVE NEGATIVE  Comprehensive metabolic panel     Status: Abnormal   Collection Time: 04/08/15  4:50 AM  Result Value Ref Range   Sodium 141 135 - 145 mmol/L   Potassium 3.9 3.5 - 5.1 mmol/L   Chloride 104 101 - 111 mmol/L   CO2 25 22 - 32 mmol/L   Glucose, Bld 91 65 - 99 mg/dL   BUN 5 (L) 6 - 20 mg/dL   Creatinine, Ser 1.61 0.61 - 1.24 mg/dL   Calcium 8.4 (L) 8.9 - 10.3 mg/dL   Total Protein 6.7 6.5 - 8.1 g/dL   Albumin 3.2 (L) 3.5 - 5.0 g/dL   AST 23 15 - 41 U/L   ALT 20 17 - 63 U/L   Alkaline Phosphatase 64 38 - 126 U/L   Total Bilirubin 0.5 0.3 - 1.2 mg/dL   GFR calc non Af Amer >60 >60 mL/min   GFR calc Af Amer >60  >60 mL/min   Anion gap 12 5 - 15  CBC with Differential/Platelet     Status: Abnormal   Collection Time: 04/08/15  4:50 AM  Result Value Ref Range   WBC 8.1 4.0 - 10.5 K/uL   RBC 4.08 (L) 4.22 - 5.81 MIL/uL   Hemoglobin 12.5 (L) 13.0 - 17.0 g/dL   HCT 09.6 (L) 04.5 - 40.9 %   MCV 93.9 78.0 - 100.0 fL   MCH 30.6 26.0 - 34.0 pg   MCHC 32.6 30.0 - 36.0 g/dL   RDW 81.1 91.4 - 78.2 %   Platelets 264 150 - 400 K/uL   Neutrophils Relative % 61 43 - 77 %   Neutro Abs 5.0 1.7 - 7.7 K/uL   Lymphocytes Relative 24 12 - 46 %   Lymphs Abs 1.9 0.7 - 4.0 K/uL   Monocytes Relative 11 3 - 12 %   Monocytes Absolute 0.9 0.1 - 1.0 K/uL   Eosinophils Relative 4 0 - 5 %   Eosinophils Absolute 0.3  0.0 - 0.7 K/uL   Basophils Relative 0 0 - 1 %   Basophils Absolute 0.0 0.0 - 0.1 K/uL  C-reactive protein     Status: Abnormal   Collection Time: 04/08/15  4:50 AM  Result Value Ref Range   CRP 1.8 (H) <1.0 mg/dL  CK     Status: None   Collection Time: 04/08/15  4:50 AM  Result Value Ref Range   Total CK 304 49 - 397 U/L  Troponin I     Status: None   Collection Time: 04/08/15  8:10 AM  Result Value Ref Range   Troponin I <0.03 <0.031 ng/mL  Sedimentation rate     Status: Abnormal   Collection Time: 04/08/15  8:10 AM  Result Value Ref Range   Sed Rate 27 (H) 0 - 16 mm/hr  Urine rapid drug screen (hosp performed)not at [facility]     Status: Abnormal   Collection Time: 04/08/15  8:52 AM  Result Value Ref Range   Opiates POSITIVE (A) NONE DETECTED   Cocaine NONE DETECTED NONE DETECTED   Benzodiazepines NONE DETECTED NONE DETECTED   Amphetamines NONE DETECTED NONE DETECTED   Tetrahydrocannabinol NONE DETECTED NONE DETECTED   Barbiturates NONE DETECTED NONE DETECTED     Assessment/Plan: Principal Problem:   Cellulitis of left upper extremity Active Problems:   History of eczema   Left upper extremity swelling   PLAN: MRI shows no fluid collection Recommend continued medical treatment for  cellulitis   MURPHY, TIMOTHY D 04/08/2015, 9:59 PM   Margarita Rana, MD Cell [phone]

## 2015-04-08 NOTE — Progress Notes (Signed)
VASCULAR LAB PRELIMINARY  PRELIMINARY  PRELIMINARY  PRELIMINARY  Left upper extremity venous duplex completed.    Preliminary report:  Left:  No evidence of DVT or superficial thrombosis.    Darril Patriarca, RVT 04/08/2015, 12:14 PM

## 2015-04-08 NOTE — ED Notes (Signed)
Report called to 6E 

## 2015-04-09 DIAGNOSIS — Z720 Tobacco use: Secondary | ICD-10-CM

## 2015-04-09 DIAGNOSIS — Z872 Personal history of diseases of the skin and subcutaneous tissue: Secondary | ICD-10-CM

## 2015-04-09 DIAGNOSIS — F1099 Alcohol use, unspecified with unspecified alcohol-induced disorder: Secondary | ICD-10-CM

## 2015-04-09 LAB — BASIC METABOLIC PANEL
Anion gap: 9 (ref 5–15)
CHLORIDE: 103 mmol/L (ref 101–111)
CO2: 25 mmol/L (ref 22–32)
CREATININE: 0.84 mg/dL (ref 0.61–1.24)
Calcium: 8.3 mg/dL — ABNORMAL LOW (ref 8.9–10.3)
GFR calc Af Amer: >60 mL/min (ref >60)
GFR calc non Af Amer: >60 mL/min (ref >60)
GLUCOSE: 86 mg/dL (ref 65–99)
Potassium: 3.8 mmol/L (ref 3.5–5.1)
SODIUM: 137 mmol/L (ref 135–145)

## 2015-04-09 LAB — CBC
HEMATOCRIT: 35.7 % — AB (ref 39.0–52.0)
Hemoglobin: 11.5 g/dL — ABNORMAL LOW (ref 13.0–17.0)
MCH: 29.8 pg (ref 26.0–34.0)
MCHC: 32.2 g/dL (ref 30.0–36.0)
MCV: 92.5 fL (ref 78.0–100.0)
Platelets: 242 10*3/uL (ref 150–400)
RBC: 3.86 MIL/uL — ABNORMAL LOW (ref 4.22–5.81)
RDW: 13.9 % (ref 11.5–15.5)
WBC: 7.2 10*3/uL (ref 4.0–10.5)

## 2015-04-09 MED ORDER — SENNOSIDES-DOCUSATE SODIUM 8.6-50 MG PO TABS
2.0000 | ORAL_TABLET | Freq: Two times a day (BID) | ORAL | Status: DC
  Administered 2015-04-09: 2 via ORAL
  Filled 2015-04-09 (×3): qty 2

## 2015-04-09 MED ORDER — ENOXAPARIN SODIUM 40 MG/0.4ML ~~LOC~~ SOLN
40.0000 mg | SUBCUTANEOUS | Status: DC
  Administered 2015-04-09: 40 mg via SUBCUTANEOUS
  Filled 2015-04-09 (×2): qty 0.4

## 2015-04-09 MED ORDER — OXYCODONE-ACETAMINOPHEN 5-325 MG PO TABS: 1.0000 | ORAL_TABLET | Freq: Four times a day (QID) | ORAL | Status: DC | PRN

## 2015-04-09 NOTE — Progress Notes (Signed)
     Subjective:  Patient reports pain as mild to moderate.  Patient feels that the swelling is starting to come down which is helping decrease the pain. He also feels that occasional numbness/tingling in the hand has improved as the swelling has come down.  Objective:   VITALS:   Filed Vitals:   04/08/15 0500 04/08/15 1055 04/08/15 1836 04/08/15 2133  BP: 145/84 157/90 149/86 133/87  Pulse: 96 88 75 84  Temp: 98.2 F (36.8 C) 98.4 F (36.9 C) 98.1 F (36.7 C) 98.8 F (37.1 C)  TempSrc: Oral Oral Oral Oral  Resp: 18 18 18 17   Height:      Weight:    97.932 kg (215 lb 14.4 oz)  SpO2: 99% 100% 100% 98%    Neurologically intact ABD soft Neurovascular intact Sensation intact distally Intact pulses distally   Left arm is stable, no worsening, sensation intact  Lab Results  Component Value Date   WBC 7.2 04/09/2015   HGB 11.5* 04/09/2015   HCT 35.7* 04/09/2015   MCV 92.5 04/09/2015   PLT 242 04/09/2015   BMET    Component Value Date/Time   NA 137 04/09/2015 0408   K 3.8 04/09/2015 0408   CL 103 04/09/2015 0408   CO2 25 04/09/2015 0408   GLUCOSE 86 04/09/2015 0408   BUN <5* 04/09/2015 0408   CREATININE 0.84 04/09/2015 0408   CALCIUM 8.3* 04/09/2015 0408   GFRNONAA >60 04/09/2015 0408   GFRAA >60 04/09/2015 0408     Assessment/Plan:     Principal Problem:   Cellulitis of left upper extremity Active Problems:   History of eczema   Left upper extremity swelling  Doppler negative for blood clot, MRI negative for fluid collection.  No indication for surgical intervention at this time.  Recommending continued treatment for cellulitis.  Will sign off at this time.  If the status changes or there are concerns for worsening infection, please notify.  Brigham Cobbins Hilda Lias 04/09/2015, 6:56 AM Cell [phone]

## 2015-04-09 NOTE — Progress Notes (Signed)
PROGRESS NOTE  Cory Reese ZOX:[id number] DOB: [birth year] DOA: 04/07/2015 PCP: No primary care provider on file.  HPI/Recap of past 24 hours:  Feeling better, less pain, less edema  Assessment/Plan: Principal Problem:   Cellulitis of left upper extremity Active Problems:   History of eczema   Left upper extremity swelling  Left upper extremity cellulitis: no DVT by ultrasound, no abscess, no joint involvement By MRI. Ortho signed off. Continue vanc/zosyn for now.  Elevated blood pressure: likely from pain, better, continue monitor.  Eczema: eucerin cream.  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     Code Status: full  Family Communication: patient and significant other  Disposition Plan: home, likely 1-2 days   Consultants:  orthopedics  Procedures:  none  Antibiotics:  Vanc/zosyn from admission   Objective: BP 118/80 mmHg  Pulse 80  Temp(Src) 98.4 F (36.9 C) (Oral)  Resp 18  Ht  (1.905 m)  Wt 97.932 kg (215 lb 14.4 oz)  BMI 26.99 kg/m2  SpO2 98%  Intake/Output Summary (Last 24 hours) at 04/09/15 1825 Last data filed at 04/08/15 2242  Gross per 24 hour  Intake    240 ml  Output    400 ml  Net   -160 ml   Filed Weights   04/07/15 2000 04/08/15 0200 04/08/15 2133  Weight: 97.523 kg (215 lb) 97.75 kg (215 lb 8 oz) 97.932 kg (215 lb 14.4 oz)    Exam:   General:  NAD  Cardiovascular: RRR  Respiratory: CTABL  Abdomen: Soft/ND/NT, positive BS  Musculoskeletal: left forearm/elbow edema much improved, less tender, range of motion improved. Good radial pulse.  Neuro: aaox3,   Psych: flat affect  Skin: diffuse eczema changes  Data Reviewed: Basic Metabolic Panel:  Recent Labs Lab 04/07/15 2030 04/08/15 0450 04/09/15 0408  NA 139 141 137  K 3.8 3.9 3.8  CL 103 104 103  CO2 GLUCOSE 97 91 86   BUN 6 5* <5*  CREATININE 0.75 0.88 0.84  CALCIUM 8.5* 8.4* 8.3*   Liver Function Tests:  Recent Labs Lab 04/08/15 0450  AST 23  ALT 20  ALKPHOS 64  BILITOT 0.5  PROT 6.7  ALBUMIN 3.2*   No results for input(s): LIPASE, AMYLASE in the last 168 hours. No results for input(s): AMMONIA in the last 168 hours. CBC:  Recent Labs Lab 04/07/15 2030 04/08/15 0450 04/09/15 0408  WBC 8.1 8.1 7.2  NEUTROABS 4.5 5.0  --   HGB 13.5 12.5* 11.5*  HCT 41.4 38.3* 35.7*  MCV 92.6 93.9 92.5  PLT 315 264 242   Cardiac Enzymes:    Recent Labs Lab 04/07/15 2030 04/08/15 0450 04/08/15 0810  CKTOTAL 436* 304  --   TROPONINI  --   --  <0.03   BNP (last 3 results) No results for input(s): BNP in the last 8760 hours.  ProBNP (last 3 results) No results for input(s): PROBNP in the last 8760 hours.  CBG: No results for input(s): GLUCAP in the last 168 hours.  Recent Results (from the past 240 hour(s))  Culture, blood (routine x 2)     Status: None (Preliminary result)   Collection Time: 04/08/15  8:10 AM  Result Value Ref Range Status   Specimen Description BLOOD RIGHT HAND  Final   Special Requests BOTTLES DRAWN AEROBIC AND ANAEROBIC 10CC  Final   Culture  Final           BLOOD CULTURE RECEIVED NO GROWTH TO DATE CULTURE WILL BE HELD FOR 5 DAYS BEFORE ISSUING A FINAL NEGATIVE REPORT Performed at [facility]    Report Status PENDING  Incomplete  Culture, blood (routine x 2)     Status: None (Preliminary result)   Collection Time: 04/08/15  8:20 AM  Result Value Ref Range Status   Specimen Description BLOOD RIGHT FOREARM  Final   Special Requests BOTTLES DRAWN AEROBIC ONLY 10CC  Final   Culture   Final           BLOOD CULTURE RECEIVED NO GROWTH TO DATE CULTURE WILL BE HELD FOR 5 DAYS BEFORE ISSUING A FINAL NEGATIVE REPORT Performed at [facility]    Report Status PENDING  Incomplete     Studies: No results found.  Scheduled Meds: . folic acid  1 mg  Oral Daily  . hydrocerin   Topical BID  . multivitamin with minerals  1 tablet Oral Daily  . piperacillin-tazobactam (ZOSYN)  IV  3.375 g Intravenous Q8H  . senna-docusate  2 tablet Oral BID  . thiamine  100 mg Oral Daily  . vancomycin  1,000 mg Intravenous Q8H    Continuous Infusions:    Time spent:  Majestic Molony MD, PhD  [facility] Pager [phone]. If 7PM-7AM, please contact night-coverage at [email or web address], password [facility] 04/09/2015, 6:25 PM  LOS: 2 days

## 2015-04-09 NOTE — Progress Notes (Addendum)
Gave pt inform on [facility]. Pt states no ins. He would like to follow up at [facility]. Placed match letter for up to 34 days of meds but not narcotics/pain meds at disch. Placed match letter on shadow chart. Called clinic but unable to set up appt. Pt will need to go by there after disch.

## 2015-04-10 LAB — BASIC METABOLIC PANEL
Anion gap: 9 (ref 5–15)
BUN: 6 mg/dL (ref 6–20)
CO2: 25 mmol/L (ref 22–32)
CREATININE: 0.81 mg/dL (ref 0.61–1.24)
Calcium: 8.9 mg/dL (ref 8.9–10.3)
Chloride: 104 mmol/L (ref 101–111)
GLUCOSE: 115 mg/dL — AB (ref 65–99)
Potassium: 3.8 mmol/L (ref 3.5–5.1)
Sodium: 138 mmol/L (ref 135–145)

## 2015-04-10 LAB — CBC
HCT: 37.6 % — ABNORMAL LOW (ref 39.0–52.0)
HEMOGLOBIN: 12 g/dL — AB (ref 13.0–17.0)
MCH: 29.7 pg (ref 26.0–34.0)
MCHC: 31.9 g/dL (ref 30.0–36.0)
MCV: 93.1 fL (ref 78.0–100.0)
PLATELETS: 238 10*3/uL (ref 150–400)
RBC: 4.04 MIL/uL — ABNORMAL LOW (ref 4.22–5.81)
RDW: 13.7 % (ref 11.5–15.5)
WBC: 7.1 10*3/uL (ref 4.0–10.5)

## 2015-04-10 LAB — C-REACTIVE PROTEIN: CRP: 2.3 mg/dL — AB (ref <1.0)

## 2015-04-10 LAB — SEDIMENTATION RATE: Sed Rate: 36 mm/hr — ABNORMAL HIGH (ref 0–16)

## 2015-04-10 MED ORDER — DOXYCYCLINE HYCLATE 100 MG PO CAPS: 100.0000 mg | ORAL_CAPSULE | Freq: Two times a day (BID) | ORAL | Status: DC

## 2015-04-10 MED ORDER — TRIAMCINOLONE 0.1 % CREAM:EUCERIN CREAM 1:1: 1.0000 "application " | TOPICAL_CREAM | Freq: Two times a day (BID) | CUTANEOUS | Status: DC

## 2015-04-10 NOTE — Progress Notes (Signed)
Patient Discharge: Disposition: Patient discharged home with wife. Education: Reviewed about medications, prescriptions, discharge instructions and follow-up appointments. understoood and acknowledged. IV: Discontinued before discharge. Telemetry: N/A Transportation: Patient transported in w/c with  Staff and wife accompanying. Belongings: Patient took all his belongings with him.  Patient given medication match letter.

## 2015-04-10 NOTE — Discharge Summary (Signed)
Discharge Summary  Cory Reese ZOX:[id number] DOB: 1975-11-06  PCP: No primary care provider on file.  Admit date: 04/07/2015 Discharge date: 04/10/2015  Time spent: <70mins  Recommendations for Outpatient Follow-up:  1. [facility] with [facility] one week  Discharge Diagnoses:  Active Hospital Problems   Diagnosis Date Noted  . Cellulitis of left upper extremity 04/08/2015  . History of eczema 04/08/2015  . Left upper extremity swelling 04/08/2015    Resolved Hospital Problems   Diagnosis Date Noted Date Resolved  No resolved problems to display.    Discharge Condition: stable  Diet recommendation: heart healthy  Filed Weights   04/08/15 0200 04/08/15 2133 04/09/15 2013  Weight: 97.75 kg (215 lb 8 oz) 97.932 kg (215 lb 14.4 oz) 98.2 kg (216 lb 7.9 oz)    History of present illness:  Cory Reese is a 39 y.o. male with history of eczema started experiencing left upper arm swelling and pain over the last 3 days. Denies any trauma or insect bite. Patient's pain started at the bicep area and gradually worsened involving the elbow and forearm. Patient also initially had some left anterior chest wall pain which has resolved at this time. On exam patient has swollen left upper extremity involving the on and the forearm with difficulty to extend the elbow area. Patient's arm is mildly warm and has skin changes assistant with eczema. Patient's pulse felt well and bounding. Patient is able to make a fist with his left hand. Since patient had some hypoxia and tachycardia in the ER with some chest pain 2 days ago patient had a CT angiogram of the chest which was negative for any PE but showed bilateral axillary adenopathy. Patient has been admitted for further management.   Hospital Course:  Principal Problem:   Cellulitis of left upper extremity Active Problems:   History of eczema   Left upper extremity swelling  Left upper extremity cellulitis: no DVT  by ultrasound, no abscess, no joint involvement By MRI. Appreciate orthopedic input. Much improved with vanc/zosyn , discharged on doxycycline x10days.   Elevated blood pressure: likely from pain, better, continue monitor.  Eczema: eucerin/steroid cream.  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     Code Status: full  Family Communication: patient and significant other  Disposition Plan: home   Consultants:  orthopedics  Procedures:  none  Antibiotics:  Vanc/zosyn from admission  Discharge Exam: BP 137/84 mmHg  Pulse 75  Temp(Src) 98.7 F (37.1 C) (Oral)  Resp 18  Ht  (1.905 m)  Wt 98.2 kg (216 lb 7.9 oz)  BMI 27.06 kg/m2  SpO2 100%   General: NAD  Cardiovascular: RRR  Respiratory: CTABL  Abdomen: Soft/ND/NT, positive BS  Musculoskeletal: left forearm/elbow edema much improved, less tender, range of motion improved. Good radial pulse.  Neuro: aaox3,   Psych: flat affect  Skin: diffuse eczema changes   Discharge Instructions You were cared for by a hospitalist during your hospital stay. If you have any questions about your discharge medications or the care you received while you were in the hospital after you are discharged, you can call the unit and asked to speak with the hospitalist on call if the hospitalist that took care of you is not available. Once you are discharged, your primary care physician will handle any further medical issues. Please  note that NO REFILLS for any discharge medications will be authorized once you are discharged, as it is imperative that you return to your primary care physician (or establish a relationship with a primary care physician if you do not have one) for your aftercare needs so that they can reassess your need for medications and monitor your lab  values.  Discharge Instructions    Diet - low sodium heart healthy    Complete by:  As directed      Increase activity slowly    Complete by:  As directed             Medication List    TAKE these medications        diphenhydrAMINE 2 % cream  Commonly known as:  BENADRYL  Apply 1 application topically 3 (three) times daily as needed for itching (eczema).     doxycycline 100 MG capsule  Commonly known as:  VIBRAMYCIN  Take 1 capsule (100 mg total) by mouth 2 (two) times daily.     naproxen sodium 220 MG tablet  Commonly known as:  ANAPROX  Take 220 mg by mouth 2 (two) times daily as needed (pain).     triamcinolone 0.1 % cream : eucerin Crea  Apply 1 application topically 2 (two) times daily.       No Known Allergies     Follow-up Information    Follow up with [facility]     In 1 week.   Why:  hospital discharge follow up   Contact information:   [address] [facility] [address] [phone]       The results of significant diagnostics from this hospitalization (including imaging, microbiology, ancillary and laboratory) are listed below for reference.    Significant Diagnostic Studies: Dg Elbow 2 Views Left  04/08/2015   CLINICAL DATA:  Pain and swelling in the forearm, nontraumatic.  EXAM: LEFT ELBOW - 2 VIEW  COMPARISON:  None.  FINDINGS: There is no evidence of fracture, dislocation, or joint effusion. There is no evidence of arthropathy or other focal bone abnormality. Soft tissues are unremarkable.  IMPRESSION: Negative.   Electronically Signed   By: Ellery Plunk M.D.   On: 04/08/2015 04:45   Dg Forearm Left  04/08/2015   CLINICAL DATA:  Nontraumatic pain and swelling in the forearm  EXAM: LEFT FOREARM - 2 VIEW  COMPARISON:  None.  FINDINGS: There is no evidence of fracture or other focal bone lesions. Soft tissues are unremarkable.  IMPRESSION: Negative.   Electronically Signed   By: Ellery Plunk  M.D.   On: 04/08/2015 04:45   Ct Angio Chest Pe W/cm &/or Wo Cm  04/07/2015   CLINICAL DATA:  LEFT arm pain and swelling for 2-3 days.  EXAM: CT ANGIOGRAPHY CHEST WITH CONTRAST  TECHNIQUE: Multidetector CT imaging of the chest was performed using the standard protocol during bolus administration of intravenous contrast. Multiplanar CT image reconstructions and MIPs were obtained to evaluate the vascular anatomy.  CONTRAST:  OMNIPAQUE IOHEXOL 350 MG/ML SOLN  COMPARISON:  Cervical spine radiograph 11/15/2012.  FINDINGS: Mediastinum: No filling defects are noted within the pulmonary arterial tree to suggest pulmonary emboli. Heart size is normal. No pericardial fluid, thickening or calcification. No acute abnormality of the thoracic aorta or other great vessels of the mediastinum. BILATERAL enlarged axillary lymph nodes without visible vascular compromise. These measure up to 11 mm short axis. No pathologically enlarged mediastinal or hilar lymph  nodes. The esophagus is normal in appearance.  Lungs/Pleura: No consolidative airspace disease. No pleural effusions. No suspicious appearing pulmonary nodules or masses.  Upper Abdomen: Visualized portions of the upper abdomen are unremarkable.  Musculoskeletal: No aggressive appearing lytic or blastic lesions are noted in the visualized portions of the skeleton. No evidence for cervical ribs.  Review of the MIP images confirms the above findings.  IMPRESSION: BILATERAL axillary adenopathy up to 11 mm short axis. Otherwise unremarkable CT chest.   Electronically Signed   By: Davonna Belling M.D.   On: 04/07/2015 21:39   Mr Elbow Left Wo Contrast  04/08/2015   CLINICAL DATA:  Swelling, tenderness, and erythema along the elbow and forearm.  EXAM: MRI OF THE LEFT ELBOW WITHOUT CONTRAST  TECHNIQUE: Multiplanar, multisequence MR imaging of the elbow was performed. No intravenous contrast was administered.  COMPARISON:  04/08/2015  FINDINGS: TENDONS  Common forearm flexor  origin: Mild tendinopathy.  Common forearm extensor origin: Mild tendinopathy.  Biceps: Unremarkable  Triceps: Unremarkable  LIGAMENTS  Medial stabilizers: Unremarkable  Lateral stabilizers:  Unremarkable  Cartilage: Unremarkable  Joint: Unremarkable  Cubital tunnel: Unremarkable  Bones: Unremarkable  Other: Extensive subcutaneous edema noted tracking in the medial portion of the distal upper arm, infiltrating the subcutaneous tissues and tracking along the superficial fascia and to a mild extent along the volar neurovascular structures. The edema is more confluent in the proximal forearm, but I do not observe a discrete drainable abscess nor is there overt intramuscular edema in the vicinity of the elbow. At the level of the elbow, the edema is especially anterior and medial.  IMPRESSION: 1. Considerable medial and anterior edema along the elbow in the subcutaneous tissues and tracking along the superficial fascia margin. Cellulitis is not excluded. No myositis seen. No abscess or joint effusion. 2. Mild common flexor and common extensor tendinopathy.   Electronically Signed   By: Gaylyn Rong M.D.   On: 04/08/2015 17:18   Mr Forearm Left W/o Cm  04/08/2015   CLINICAL DATA:  Swelling, tenderness, and erythema extending along the forearm.  EXAM: MRI OF THE LEFT FOREARM WITHOUT CONTRAST  TECHNIQUE: Multiplanar, multisequence MR imaging was performed. No intravenous contrast was administered.  COMPARISON:  04/08/2015  FINDINGS: The abnormal subcutaneous edema tracking from the medial and anterior elbow extends along the volar forearm in the subcutaneous tissues, without discrete abscess. Edema is somewhat confluent along the superficial fascia margin but no significant myositis or deep fasciitis is identified. No gas in the soft tissues or drainable abscess is observed.  No significant abnormal osseous edema is identified to suggest osteomyelitis.  IMPRESSION: 1. The abnormal edema which is most confluent  along the anteromedial elbow and proximal forearm extends in the distal forearm essentially to the level of the radiocarpal joint. No associated myositis, deep fasciitis, abscess, or osteomyelitis identified.   Electronically Signed   By: Gaylyn Rong M.D.   On: 04/08/2015 17:30    Microbiology: Recent Results (from the past 240 hour(s))  Culture, blood (routine x 2)     Status: None (Preliminary result)   Collection Time: 04/08/15  8:10 AM  Result Value Ref Range Status   Specimen Description BLOOD RIGHT HAND  Final   Special Requests BOTTLES DRAWN AEROBIC AND ANAEROBIC 10CC  Final   Culture   Final           BLOOD CULTURE RECEIVED NO GROWTH TO DATE CULTURE WILL BE HELD FOR 5 DAYS BEFORE ISSUING A FINAL NEGATIVE REPORT  Performed at [facility]    Report Status PENDING  Incomplete  Culture, blood (routine x 2)     Status: None (Preliminary result)   Collection Time: 04/08/15  8:20 AM  Result Value Ref Range Status   Specimen Description BLOOD RIGHT FOREARM  Final   Special Requests BOTTLES DRAWN AEROBIC ONLY 10CC  Final   Culture   Final           BLOOD CULTURE RECEIVED NO GROWTH TO DATE CULTURE WILL BE HELD FOR 5 DAYS BEFORE ISSUING A FINAL NEGATIVE REPORT Performed at [facility]    Report Status PENDING  Incomplete     Labs: Basic Metabolic Panel:  Recent Labs Lab 04/07/15 2030 04/08/15 0450 04/09/15 0408 04/10/15 0408  NA 139 141 137 138  K 3.8 3.9 3.8 3.8  CL 103 104 103 104  CO2 GLUCOSE 97 91 86 115*  BUN 6 5* <5* 6  CREATININE 0.75 0.88 0.84 0.81  CALCIUM 8.5* 8.4* 8.3* 8.9   Liver Function Tests:  Recent Labs Lab 04/08/15 0450  AST 23  ALT 20  ALKPHOS 64  BILITOT 0.5  PROT 6.7  ALBUMIN 3.2*   No results for input(s): LIPASE, AMYLASE in the last 168 hours. No results for input(s): AMMONIA in the last 168 hours. CBC:  Recent Labs Lab 04/07/15 2030 04/08/15 0450 04/09/15 0408 04/10/15 0408  WBC 8.1 8.1 7.2  7.1  NEUTROABS 4.5 5.0  --   --   HGB 13.5 12.5* 11.5* 12.0*  HCT 41.4 38.3* 35.7* 37.6*  MCV 92.6 93.9 92.5 93.1  PLT 315 264 242 238   Cardiac Enzymes:  Recent Labs Lab 04/07/15 2030 04/08/15 0450 04/08/15 0810  CKTOTAL 436* 304  --   TROPONINI  --   --  <0.03   BNP: BNP (last 3 results) No results for input(s): BNP in the last 8760 hours.  ProBNP (last 3 results) No results for input(s): PROBNP in the last 8760 hours.  CBG: No results for input(s): GLUCAP in the last 168 hours.     SignedAlbertine Grates MD, PhD  [facility] 04/10/2015, 9:49 AM

## 2015-04-10 NOTE — Progress Notes (Signed)
Subjective:     Patient reports pain as mild.  Patient complains of some "tightness", otherwise no pain.  No numbness/tingling.  Asking if he can go home today.  Objective: Vital signs in last 24 hours: Temp:  [97.9 F (36.6 C)-98.4 F (36.9 C)] 97.9 F (36.6 C) (06/11 0528) Pulse Rate:  [70-87] 70 (06/11 0528) Resp:  [16-18] 18 (06/11 0528) BP: (118-151)/(79-88) 151/88 mmHg (06/11 0528) SpO2:  [98 %-100 %] 100 % (06/11 0528) Weight:  [98.2 kg (216 lb 7.9 oz)] 98.2 kg (216 lb 7.9 oz) (06/10 2013)  Intake/Output from previous day: 06/10 0701 - 06/11 0700 In: 120 [P.O.:120] Out: 500 [Urine:500] Intake/Output this shift:     Recent Labs  04/07/15 2030 04/08/15 0450 04/09/15 0408 04/10/15 0408  HGB 13.5 12.5* 11.5* 12.0*    Recent Labs  04/09/15 0408 04/10/15 0408  WBC 7.2 7.1  RBC 3.86* 4.04*  HCT 35.7* 37.6*  PLT 242 238    Recent Labs  04/09/15 0408 04/10/15 0408  NA 137 138  K 3.8 3.8  CL 103 104  CO2 25 25  BUN <5* 6  CREATININE 0.84 0.81  GLUCOSE 86 115*  CALCIUM 8.3* 8.9    Recent Labs  04/07/15 2030  INR 0.98    Neurologically intact Neurovascular intact Sensation intact distally Intact pulses distally Compartment soft  Assessment/Plan:     Advance diet  MRI confirms LUE cellulitis Continue ivabx per medicine Continue plan per medicine WBAT LUE  Otilio Saber 04/10/2015, 8:02 AM

## 2015-04-14 LAB — CULTURE, BLOOD (ROUTINE X 2)
Culture: NO GROWTH
Culture: NO GROWTH

## 2015-04-22 ENCOUNTER — Inpatient Hospital Stay: Payer: Self-pay | Admitting: Family Medicine

## 2015-04-23 ENCOUNTER — Inpatient Hospital Stay: Payer: Self-pay | Admitting: Family Medicine

## 2015-04-24 ENCOUNTER — Encounter (HOSPITAL_BASED_OUTPATIENT_CLINIC_OR_DEPARTMENT_OTHER): Payer: Self-pay | Admitting: *Deleted

## 2015-04-24 ENCOUNTER — Emergency Department (HOSPITAL_BASED_OUTPATIENT_CLINIC_OR_DEPARTMENT_OTHER)
Admission: EM | Admit: 2015-04-24 | Discharge: 2015-04-24 | Disposition: A | Discharge status: Home / Self Care | Payer: Self-pay | Attending: Emergency Medicine | Admitting: Emergency Medicine

## 2015-04-24 DIAGNOSIS — T490X5A Adverse effect of local antifungal, anti-infective and anti-inflammatory drugs, initial encounter: Secondary | ICD-10-CM | POA: Insufficient documentation

## 2015-04-24 DIAGNOSIS — T50905A Adverse effect of unspecified drugs, medicaments and biological substances, initial encounter: Secondary | ICD-10-CM

## 2015-04-24 DIAGNOSIS — T450X5A Adverse effect of antiallergic and antiemetic drugs, initial encounter: Secondary | ICD-10-CM | POA: Insufficient documentation

## 2015-04-24 DIAGNOSIS — Z7952 Long term (current) use of systemic steroids: Secondary | ICD-10-CM | POA: Insufficient documentation

## 2015-04-24 DIAGNOSIS — L560 Drug phototoxic response: Secondary | ICD-10-CM | POA: Insufficient documentation

## 2015-04-24 DIAGNOSIS — Z720 Tobacco use: Secondary | ICD-10-CM | POA: Insufficient documentation

## 2015-04-24 DIAGNOSIS — W899XXA Exposure to unspecified man-made visible and ultraviolet light, initial encounter: Secondary | ICD-10-CM | POA: Insufficient documentation

## 2015-04-24 DIAGNOSIS — Z792 Long term (current) use of antibiotics: Secondary | ICD-10-CM | POA: Insufficient documentation

## 2015-04-24 DIAGNOSIS — L568 Other specified acute skin changes due to ultraviolet radiation: Secondary | ICD-10-CM

## 2015-04-24 DIAGNOSIS — L309 Dermatitis, unspecified: Secondary | ICD-10-CM | POA: Insufficient documentation

## 2015-04-24 MED ORDER — DIPHENHYDRAMINE HCL 25 MG PO TABS: 25.0000 mg | ORAL_TABLET | Freq: Four times a day (QID) | ORAL | Status: DC

## 2015-04-24 MED ORDER — PREDNISONE 20 MG PO TABS: ORAL_TABLET | ORAL | Status: DC

## 2015-04-24 NOTE — ED Provider Notes (Signed)
CSN: [id number]     Arrival date & time 04/24/15  1021 History   First MD Initiated Contact with Patient 04/24/15 1031     Chief Complaint  Patient presents with  . Rash     (Consider location/radiation/quality/duration/timing/severity/associated sxs/prior Treatment) HPI The patient reports that he is having a very bad rash on his arms and legs. It is itching and seeping. He states it has gotten worse since his recent discharge from the hospital. He is putting a triamcinolone cream and Benadryl cream on the rash. Of note he did go swimming and it seemed to get much worse after that as well. He has not had fever or general illness. He has had problems with fairly severe eczema but this seems worse. Past Medical History  Diagnosis Date  . Eczema   . Back pain    Past Surgical History  Procedure Laterality Date  . Knee surgery     Family History  Problem Relation Age of Onset  . Hypertension Mother   . Hypertension Father   . Hyperlipidemia Neg Hx   . Heart attack Neg Hx   . Diabetes Neg Hx   . Sudden death Neg Hx    History  Substance Use Topics  . Smoking status: Current Every Day Smoker -- 1.00 packs/day    Types: Cigarettes  . Smokeless tobacco: Not on file  . Alcohol Use: Yes     Comment: 5th per day of vodka    Review of Systems 10 Systems reviewed and are negative for acute change except as noted in the HPI.    Allergies  Review of patient's allergies indicates no known allergies.  Home Medications   Prior to Admission medications   Medication Sig Start Date End Date Taking? Authorizing Provider  diphenhydrAMINE (BENADRYL) 2 % cream Apply 1 application topically 3 (three) times daily as needed for itching (eczema).    Historical Provider, MD  diphenhydrAMINE (BENADRYL) 25 MG tablet Take 1 tablet (25 mg total) by mouth every 6 (six) hours. 04/24/15   Arby Barrette, MD  doxycycline (VIBRAMYCIN) 100 MG capsule Take 1 capsule (100 mg total) by mouth 2 (two) times  daily. 04/10/15   Albertine Grates, MD  naproxen sodium (ANAPROX) 220 MG tablet Take 220 mg by mouth 2 (two) times daily as needed (pain).    Historical Provider, MD  predniSONE (DELTASONE) 20 MG tablet 3 tabs po daily x 3 days, then 2 tabs x 3 days, then 1.5 tabs x 3 days, then 1 tab x 3 days, then 0.5 tabs x 3 days 04/24/15   Arby Barrette, MD  Triamcinolone Acetonide (TRIAMCINOLONE 0.1 % CREAM : EUCERIN) CREA Apply 1 application topically 2 (two) times daily. 04/10/15   Albertine Grates, MD   BP 158/89 mmHg  Pulse 107  Temp(Src) 97.6 F (36.4 C) (Oral)  Resp 20  SpO2 99% Physical Exam  Constitutional: He is oriented to person, place, and time. He appears well-developed and well-nourished.  HENT:  Head: Normocephalic and atraumatic.  Nose: Nose normal.  Mouth/Throat: Oropharynx is clear and moist.  Eyes: EOM are normal. Pupils are equal, round, and reactive to light. Right eye exhibits no discharge. Left eye exhibits no discharge.  Neck: Neck supple.  Cardiovascular: Normal rate, regular rhythm, normal heart sounds and intact distal pulses.   Pulmonary/Chest: Effort normal and breath sounds normal.  Abdominal: Soft. Bowel sounds are normal. He exhibits no distension. There is no tenderness.  Musculoskeletal: Normal range of motion. He exhibits edema.  Patient has some diffuse appearing edema of his lower portions of the upper extremities. This appears to be most related to the intensity of dermatitis rash that is present. See attached images.  Neurological: He is alert and oriented to person, place, and time. He has normal strength. Coordination normal. GCS eye subscore is 4. GCS verbal subscore is 5. GCS motor subscore is 6.  Skin: Skin is warm, dry and intact. Rash noted.  Patient has a dense papular rash with some exudative portions that is very concentrated on his arms and legs. This distinctly follows a pattern of sun exposed areas. He reports wearing a sleepless T-shirt when he was out in the sun, the  back chest and abdomen as well as the bathing suit area and upper legs are spared. The rash is most intense on the back of the neck above the collar line, on the arms also following the line of a T-shirt and on the anterior aspects of the lower legs.  Psychiatric: He has a normal mood and affect.    ED Course  Procedures (including critical care time) Labs Review Labs Reviewed - No data to display  Imaging Review No results found.   EKG Interpretation None      MDM   Final diagnoses:  Drug-induced photosensitivity  Eczema   At the time of the patient's hospital admission he was evaluated by both MRI and ultrasound to rule out any evidence of abscess, DVT or deep space infection in association with his rash and swelling. At this time there is no indication of such diagnoses. The presentation is for skin changes which are likely a photosensitivity reaction that is much more intense secondary to the patient's pre-existing eczema. The areas of the skin that were covered are free of any significant rash. The patient is otherwise nontoxic and well in appearance. He will be given a course of prednisone for the rash and advised for follow-up this week.            Arby Barrette, MD 04/24/15 1058

## 2015-04-24 NOTE — Discharge Instructions (Signed)
Eczema It appears that you have developed a rash on sun exposed areas. This is likely the combination of increased sensitivity to sun exposure from recent medications, as well as a tendency to have more severe rash with your underlying eczema.  Eczema, also called atopic dermatitis, is a skin disorder that causes inflammation of the skin. It causes a red rash and dry, scaly skin. The skin becomes very itchy. Eczema is generally worse during the cooler winter months and often improves with the warmth of summer. Eczema usually starts showing signs in infancy. Some children outgrow eczema, but it may last through adulthood.  CAUSES  The exact cause of eczema is not known, but it appears to run in families. People with eczema often have a family history of eczema, allergies, asthma, or hay fever. Eczema is not contagious. Flare-ups of the condition may be caused by:   Contact with something you are sensitive or allergic to.   Stress. SIGNS AND SYMPTOMS  Dry, scaly skin.   Red, itchy rash.   Itchiness. This may occur before the skin rash and may be very intense.  DIAGNOSIS  The diagnosis of eczema is usually made based on symptoms and medical history. TREATMENT  Eczema cannot be cured, but symptoms usually can be controlled with treatment and other strategies. A treatment plan might include:  Controlling the itching and scratching.   Use over-the-counter antihistamines as directed for itching. This is especially useful at night when the itching tends to be worse.   Use over-the-counter steroid creams as directed for itching.   Avoid scratching. Scratching makes the rash and itching worse. It may also result in a skin infection (impetigo) due to a break in the skin caused by scratching.   Keeping the skin well moisturized with creams every day. This will seal in moisture and help prevent dryness. Lotions that contain alcohol and water should be avoided because they can dry the skin.    Limiting exposure to things that you are sensitive or allergic to (allergens).   Recognizing situations that cause stress.   Developing a plan to manage stress.  HOME CARE INSTRUCTIONS   Only take over-the-counter or prescription medicines as directed by your health care provider.   Do not use anything on the skin without checking with your health care provider.   Keep baths or showers short (5 minutes) in warm (not hot) water. Use mild cleansers for bathing. These should be unscented. You may add nonperfumed bath oil to the bath water. It is best to avoid soap and bubble bath.   Immediately after a bath or shower, when the skin is still damp, apply a moisturizing ointment to the entire body. This ointment should be a petroleum ointment. This will seal in moisture and help prevent dryness. The thicker the ointment, the better. These should be unscented.   Keep fingernails cut short. Children with eczema may need to wear soft gloves or mittens at night after applying an ointment.   Dress in clothes made of cotton or cotton blends. Dress lightly, because heat increases itching.   A child with eczema should stay away from anyone with fever blisters or cold sores. The virus that causes fever blisters (herpes simplex) can cause a serious skin infection in children with eczema. SEEK MEDICAL CARE IF:   Your itching interferes with sleep.   Your rash gets worse or is not better within 1 week after starting treatment.   You see pus or soft yellow scabs in the  rash area.   You have a fever.   You have a rash flare-up after contact with someone who has fever blisters.  Document Released: 10/13/2000 Document Revised: 08/06/2013 Document Reviewed: 05/19/2013 [facility] Patient Information 2015 [place], [place]. This information is not intended to replace advice given to you by your health care provider. Make sure you discuss any questions you have with your health care  provider.

## 2015-04-24 NOTE — ED Notes (Signed)
PATIENT C/O SEEPING RASH ON UPPER AND LOWER TRUNK. HE WAS ADMITTED TO THE HOSPITAL EARLIER THIS MONTH FOR CELLULITIS AND PLACED ON ANTIBIOTICS

## 2015-05-12 ENCOUNTER — Emergency Department (HOSPITAL_BASED_OUTPATIENT_CLINIC_OR_DEPARTMENT_OTHER)
Admission: EM | Admit: 2015-05-12 | Discharge: 2015-05-12 | Discharge status: Against Medical Advice | Payer: Self-pay | Attending: Dermatology | Admitting: Dermatology

## 2015-05-12 ENCOUNTER — Encounter (HOSPITAL_BASED_OUTPATIENT_CLINIC_OR_DEPARTMENT_OTHER): Payer: Self-pay

## 2015-05-12 DIAGNOSIS — R21 Rash and other nonspecific skin eruption: Secondary | ICD-10-CM | POA: Insufficient documentation

## 2015-05-12 DIAGNOSIS — Z720 Tobacco use: Secondary | ICD-10-CM | POA: Insufficient documentation

## 2015-05-12 NOTE — ED Notes (Signed)
Pt reports recently admitted for same, rash to bilateral arms extending minorly into trunchal area.  Pruritic.  Reports got better with admission and \\is  now getting worse again.

## 2015-05-12 NOTE — ED Notes (Signed)
Pt walked around corner and states he has to go, family member need to go to work, ambulatory out door, did not sign.

## 2015-05-13 ENCOUNTER — Emergency Department (HOSPITAL_BASED_OUTPATIENT_CLINIC_OR_DEPARTMENT_OTHER)
Admission: EM | Admit: 2015-05-13 | Discharge: 2015-05-13 | Disposition: A | Discharge status: Home / Self Care | Payer: Self-pay | Attending: Emergency Medicine | Admitting: Emergency Medicine

## 2015-05-13 ENCOUNTER — Encounter (HOSPITAL_BASED_OUTPATIENT_CLINIC_OR_DEPARTMENT_OTHER): Payer: Self-pay | Admitting: *Deleted

## 2015-05-13 DIAGNOSIS — Z792 Long term (current) use of antibiotics: Secondary | ICD-10-CM | POA: Insufficient documentation

## 2015-05-13 DIAGNOSIS — Z720 Tobacco use: Secondary | ICD-10-CM | POA: Insufficient documentation

## 2015-05-13 DIAGNOSIS — F4310 Post-traumatic stress disorder, unspecified: Secondary | ICD-10-CM | POA: Insufficient documentation

## 2015-05-13 DIAGNOSIS — L309 Dermatitis, unspecified: Secondary | ICD-10-CM

## 2015-05-13 DIAGNOSIS — Z7952 Long term (current) use of systemic steroids: Secondary | ICD-10-CM | POA: Insufficient documentation

## 2015-05-13 DIAGNOSIS — F418 Other specified anxiety disorders: Secondary | ICD-10-CM | POA: Insufficient documentation

## 2015-05-13 HISTORY — DX: Other specified anxiety disorders: F41.8

## 2015-05-13 HISTORY — DX: Post-traumatic stress disorder, unspecified: F43.10

## 2015-05-13 MED ORDER — HYDROCODONE-ACETAMINOPHEN 5-325 MG PO TABS
2.0000 | ORAL_TABLET | Freq: Once | ORAL | Status: AC
  Administered 2015-05-13: 2 via ORAL
  Filled 2015-05-13: qty 2

## 2015-05-13 MED ORDER — HYDROCORTISONE 0.5 % EX OINT: 1.0000 "application " | TOPICAL_OINTMENT | Freq: Two times a day (BID) | CUTANEOUS | Status: DC

## 2015-05-13 MED ORDER — PREDNISONE 20 MG PO TABS: ORAL_TABLET | ORAL | Status: DC

## 2015-05-13 MED ORDER — HYDROCODONE-ACETAMINOPHEN 5-325 MG PO TABS
1.0000 | ORAL_TABLET | Freq: Four times a day (QID) | ORAL | Status: DC | PRN
Start: 2015-05-13 — End: 2016-04-13

## 2015-05-13 MED ORDER — PREDNISONE 50 MG PO TABS
60.0000 mg | ORAL_TABLET | Freq: Once | ORAL | Status: AC
  Administered 2015-05-13: 60 mg via ORAL
  Filled 2015-05-13 (×2): qty 1

## 2015-05-13 NOTE — Discharge Instructions (Signed)
Eczema °Eczema, also called atopic dermatitis, is a skin disorder that causes inflammation of the skin. It causes a red rash and dry, scaly skin. The skin becomes very itchy. Eczema is generally worse during the cooler winter months and often improves with the warmth of summer. Eczema usually starts showing signs in infancy. Some children outgrow eczema, but it may last through adulthood.  °CAUSES  °The exact cause of eczema is not known, but it appears to run in families. People with eczema often have a family history of eczema, allergies, asthma, or hay fever. Eczema is not contagious. °Flare-ups of the condition may be caused by:  °· Contact with something you are sensitive or allergic to.   °· Stress. °SIGNS AND SYMPTOMS °· Dry, scaly skin.   °· Red, itchy rash.   °· Itchiness. This may occur before the skin rash and may be very intense.   °DIAGNOSIS  °The diagnosis of eczema is usually made based on symptoms and medical history. °TREATMENT  °Eczema cannot be cured, but symptoms usually can be controlled with treatment and other strategies. A treatment plan might include: °· Controlling the itching and scratching.   °¨ Use over-the-counter antihistamines as directed for itching. This is especially useful at night when the itching tends to be worse.   °¨ Use over-the-counter steroid creams as directed for itching.   °¨ Avoid scratching. Scratching makes the rash and itching worse. It may also result in a skin infection (impetigo) due to a break in the skin caused by scratching.   °· Keeping the skin well moisturized with creams every day. This will seal in moisture and help prevent dryness. Lotions that contain alcohol and water should be avoided because they can dry the skin.   °· Limiting exposure to things that you are sensitive or allergic to (allergens).   °· Recognizing situations that cause stress.   °· Developing a plan to manage stress.   °HOME CARE INSTRUCTIONS  °· Only take over-the-counter or  prescription medicines as directed by your health care provider.   °· Do not use anything on the skin without checking with your health care provider.   °· Keep baths or showers short (5 minutes) in warm (not hot) water. Use mild cleansers for bathing. These should be unscented. You may add nonperfumed bath oil to the bath water. It is best to avoid soap and bubble bath.   °· Immediately after a bath or shower, when the skin is still damp, apply a moisturizing ointment to the entire body. This ointment should be a petroleum ointment. This will seal in moisture and help prevent dryness. The thicker the ointment, the better. These should be unscented.   °· Keep fingernails cut short. Children with eczema may need to wear soft gloves or mittens at night after applying an ointment.   °· Dress in clothes made of cotton or cotton blends. Dress lightly, because heat increases itching.   °· A child with eczema should stay away from anyone with fever blisters or cold sores. The virus that causes fever blisters (herpes simplex) can cause a serious skin infection in children with eczema. °SEEK MEDICAL CARE IF:  °· Your itching interferes with sleep.   °· Your rash gets worse or is not better within 1 week after starting treatment.   °· You see pus or soft yellow scabs in the rash area.   °· You have a fever.   °· You have a rash flare-up after contact with someone who has fever blisters.   °Document Released: 10/13/2000 Document Revised: 08/06/2013 Document Reviewed: 05/19/2013 °[employer]® Patient Information ©2015 [employer]. This information   is not intended to replace advice given to you by your health care provider. Make sure you discuss any questions you have with your health care provider. ° ° °Emergency Department Resource Guide °1) Find a Doctor and Pay Out of Pocket °Although you won't have to find out who is covered by your insurance plan, it is a good idea to ask around and get recommendations. You will then need  to call the office and see if the doctor you have chosen will accept you as a new patient and what types of options they offer for patients who are self-pay. Some doctors offer discounts or will set up payment plans for their patients who do not have insurance, but you will need to ask so you aren't surprised when you get to your appointment. ° °2) Contact Your Local Health Department °Not all health departments have doctors that can see patients for sick visits, but many do, so it is worth a call to see if yours does. If you don't know where your local health department is, you can check in your phone book. The CDC also has a tool to help you locate your state's health department, and many state websites also have listings of all of their local health departments. ° °3) Find a Walk-in Clinic °If your illness is not likely to be very severe or complicated, you may want to try a walk in clinic. These are popping up all over the country in pharmacies, drugstores, and shopping centers. They're usually staffed by nurse practitioners or physician assistants that have been trained to treat common illnesses and complaints. They're usually fairly quick and inexpensive. However, if you have serious medical issues or chronic medical problems, these are probably not your best option. ° °No Primary Care Doctor: °- Call [facility] at  832-8000 - they can help you locate a primary care doctor that  accepts your insurance, provides certain services, etc. °- Physician Referral Service- 1-800-533-3463 ° °Chronic Pain Problems: °Organization         Address  Phone   Notes  ° [facility]  (336) 297-2271 Patients need to be referred by their primary care doctor.  ° °Medication Assistance: °Organization         Address  Phone   Notes  °[place] [facility] [address] °[place], [place] [address] (336) 641-8030 --Must be a resident of [place] °-- Must have NO insurance  coverage whatsoever (no Medicaid/ Medicare, etc.) °-- The pt. MUST have a primary care doctor that directs their care regularly and follows them in the community °  °[facility]  (866) 331-1348   °[employer]  (888) 892-1162   ° °Agencies that provide inexpensive medical care: °Organization         Address  Phone   Notes  °[facility]  (336) 832-8035   °[facility]    (336) 832-7272   °[facility] [address] °[place], [place] [address] (336) 832-4777   °[facility] [address], °[place] (336) 271-4999   °[facility]    (336) 373-0678   °[facility]    (336) 272-1050   °[facility] ° [address], [place] Phone:  (336) 832-4444, Fax:  (336) 832-4440 Hours of Operation:  9 am - 6 pm, M-F.  Also accepts Medicaid/Medicare and self-pay.  °[facility] ° [address]   [address], [place] Phone: (336) 832-3150, Fax: (336) [phone]. Hours of Operation:  8:30 am - 5:30 pm, M-F.  Also accepts Medicaid and self-pay.  °[facility] [place] [address], [place] Phone: (336) 878-6027   °[facility] [address], [place], [place] (336)723-1848, Ext. 123 Mondays & Thursdays: 7-9 AM.  First 15 patients are seen on a first come, first serve basis. °  ° °Medicaid-accepting [place] Providers: ° °Organization         Address  Phone   Notes  °[facility] [address], [place] (336) 641-2100 Also accepts self-pay patients.  °[facility] [address], [place] ° (336) 856-9996   °[facility] [address], [place] (336) 288-8857   °[facility] [address] [place] [address], [place] (336) 299-7000   °Veita Bland [address], [place]  ° (336) 373-1557 Only accepts [place] Access Medicaid patients after they have their  name applied to their card.  ° °Self-Pay (no insurance) in [place]: ° °Organization         Address  Phone   Notes  °Sickle Cell Patients, [facility] [address], [place] (336) 832-1970   °[facility] [address], [place] (336) 832-4400   °[facility] ° [address], [place] (336) 992-4800   °[facility]/Dr. Osei-Bonsu ° [address] [place] [address], [place] or [address], [place] (336) 841-8500 Phone number for both [place] and [place] locations is the same.  °[facility] [address], [place] (336) 299-0000   °[facility] [address] [place] [address], [place] or [address] (336) 852-7530 °(336) 878-2260   °[facility] [address], [place] (336) 350-1642, phone; (336) 294-5005, fax Sees patients 1st and 3rd Saturday of every month.  Must not qualify for public or private insurance (i.e. Medicaid, Medicare, [place] Health Choice, Veterans' Benefits) • Household income should be no more than 200% of the poverty level •The clinic cannot treat you if you are pregnant or think you are pregnant • Sexually transmitted diseases are not treated at the clinic.  ° ° °Dental Care: °Organization         Address  Phone  Notes  °[facility] [address], [place] (336) 641-6152 Accepts children up to age 21 who are enrolled in Medicaid or [place] Health Choice; pregnant women with a Medicaid card; and children who have applied for Medicaid or [place] Health Choice, but were declined, whose parents can pay a reduced fee at time of service.  °[facility] [place]  [address], [place] (336) 641-7733 Accepts children up to age 21 who are enrolled in Medicaid or [place] Health Choice; pregnant women with a Medicaid card; and children who have applied for  Medicaid or [place] Health Choice, but were declined, whose parents can pay a reduced fee at time of service.  °[facility] ° [address], [place] (336) 641-4533 Patients are seen by appointment only. Walk-ins are not accepted. [facility] will see patients 18 years of age and older. °Monday - Tuesday (8am-5pm) °Most Wednesdays (8:30-5pm) °$30 per visit, cash only  °[facility] °   [address], [place] (336) 641-4533 Patients are seen by appointment only. Walk-ins are not accepted. [facility] will see patients 18 years of age and older. °One Wednesday Evening (Monthly: Volunteer Based).  $30 per visit, cash only  °[facility]  (919) 537-3737 for adults; Children under age 4, call Graduate Pediatric Dentistry at ([phone]) [phone]. Children aged 4-14, please call ([phone]) [phone] to request a pediatric application. ° Dental services are provided in all areas of dental care including fillings, crowns and bridges, complete and partial dentures, implants, gum treatment, root canals, and extractions. Preventive care is also provided. Treatment is provided to both adults and children. °Patients are selected via a lottery and there is often a waiting list. °  °[facility] [address], °[place] ° (336) 763-8833 [email or web address] °  °[facility] [address], [place], [place] (336)723-1848, Ext. 123 Second and Fourth Thursday of each month, opens at 6:30 AM; Clinic ends at 9 AM.  Patients are seen on a first-come first-served basis, and a limited number are seen during each clinic.  ° °[facility] ° [address], [place], [place] (336) 723-7904   Eligibility Requirements °You must have lived in [place], [place], or [place] counties for at least the last three months. °  You cannot be eligible for state or federal sponsored healthcare insurance, including Veterans Administration, Medicaid,  or Medicare. °  You generally cannot be eligible for healthcare insurance through your employer.  °  How to apply: °Eligibility screenings are held every Tuesday and Wednesday afternoon from 1:00 pm until 4:00 pm. You do not need an appointment for the interview!  °[facility] [address], [place], [place] 336-631-2330   °[facility]  336-342-8273   °[facility]  336-703-3100   °[facility]  336-570-6415   ° °Behavioral Health Resources in the Community: °Intensive Outpatient Programs °Organization         Address  Phone  Notes  °[facility] [address], [place], [place] 336-878-6098   °[facility] [address], [place], [place] 336-832-9800   °[facility] [address], [place], [place] ° 336-882-2125   °[facility] [address],  °[place], [place] 1-800-853-5163 or 336-641-4981   °Substance Abuse Resources °Organization         Address  Phone  Notes  °[facility]  336-882-2125   °[facility]  336-784-9470   °[facility]  336-285-9073   °[facility]  336-845-3988   °Residential & Outpatient Substance Abuse Program  1-800-659-3381   °Psychological Services °Organization         Address  Phone  Notes  °[facility]  336- 832-9600   °[facility]  336- 378-7881   °[facility] [address], [place] 1-800-853-5163 or 336-641-4981   ° °Mobile Crisis Teams °Organization         Address  Phone  Notes  °[facility], Mobile Crisis Care Unit  1-877-626-1772   °Assertive °[facility] ° [address] [place], [place] 336-834-9664   °Sharon DeEsch [address] °[place] 336-554-5454   ° °Self-Help/Support Groups °Organization         Address  Phone             Notes  °[facility] -   variety of support groups   336- 373-1402 Call for more information  °Narcotics Anonymous (NA), [facility] [address], °[place]  2 meetings at this location  ° °Residential Treatment Programs °Organization         Address  Phone  Notes  °[facility] [address],    °[place]  1-866-801-8205   °[facility] ° [address], [place], [place] 704-293-8524   °[facility] [address], [place] 336-845-3988 Admissions: 8am-3pm M-F  °[facility] [address]    °[place], [place] 336-841-1104   °[facility] [address], [place], [place] 336-379-7146   °[facility] [address]  °[place], [place] 336-285-9073   °[facility] Intensive Outpatient [address], [place], [place] 336-852-3033   °[facility] [address]  °[place], [place] 1-877-615-2722 or 336-784-9470   °[facility] [address] [place], [place] 336-227-7417 Accepts Medicaid  °[facility] [address]  °[place] 1-800-659-3381 Substance Abuse/Addiction Treatment  ° °[place] Behavioral Health Resources °Organization         Address  Phone  Notes  °[facility]  (888) 581-9988   °Julie Brannon, PhD [address] [place], [place]   (336) 349-5553 or (336) 951-0000   °[facility]   [address] °[place], [place] (336) 349-4454   °[facility] [address], [place], [place] (336) 342-8316 Insurance/Medicaid/sponsorship through [facility]  °[facility] [address]                                    [place], [place] (336) 342-8316 Therapy/tele-psych/case  °[facility] [address]  ° [place], [place] (336) 349-2233    °Dr. Arfeen  (336) 349-4544   °[facility] [place]  [employer] [place] [facility] 1) [address], [place] °2) [address], [place] °3)  [address], [place] (336) 349-3220 °(336) 342-7768 ° °(336) 342-8140   °[place] Child Abuse Hotline (336) 342-1394 or (336) 342-3537 (After Hours)    ° ° ° °

## 2015-05-13 NOTE — ED Provider Notes (Signed)
CSN: [id number]     Arrival date & time 05/13/15  0848 History   First MD Initiated Contact with Patient 05/13/15 [phone]     Chief Complaint  Patient presents with  . Rash     (Consider location/radiation/quality/duration/timing/severity/associated sxs/prior Treatment) HPI Comments: Here with bilateral forearm rash. Hx of eczema. He was recently admitted for left arm swelling and cellulitis. He had a negative ultrasound for any DVT. He is put on a prednisone taper which really helped his eczema. When he stopped his prednisone taper about 4 days ago, the rash worsened and came back again. He has mild eczema baseline but after the finishing a sterile taper it worsened. Is increasing with pain and worsening patchiness. No fevers, nausea, vomiting  Patient is a 39 y.o. male presenting with rash. The history is provided by the patient.  Rash Location:  Shoulder/arm and head/neck Head/neck rash location:  L neck and R neck Shoulder/arm rash location:  L wrist, R wrist, R elbow, L elbow, L axilla, R axilla, R shoulder, L shoulder, L arm, R arm, R upper arm, L upper arm, L forearm, R forearm, L hand and R hand Quality: painful and swelling (mild, areas of the distal L forearm)   Quality: not bruising, not burning and not red   Pain details:    Quality:  Aching Severity:  Moderate Onset quality:  Gradual Duration:  4 days Timing:  Intermittent Progression:  Worsening Context comment:  Recently on so it's for this and finished 4 days ago Relieved by:  Nothing Worsened by:  Nothing tried Associated symptoms: no fever and no shortness of breath     Past Medical History  Diagnosis Date  . Eczema   . Back pain   . Depression with anxiety   . PTSD (post-traumatic stress disorder)    Past Surgical History  Procedure Laterality Date  . Knee surgery     Family History  Problem Relation Age of Onset  . Hypertension Mother   . Hypertension Father   . Hyperlipidemia Neg Hx   . Heart attack  Neg Hx   . Diabetes Neg Hx   . Sudden death Neg Hx    History  Substance Use Topics  . Smoking status: Current Every Day Smoker -- 1.00 packs/day    Types: Cigarettes  . Smokeless tobacco: Never Used  . Alcohol Use: Yes     Comment: occ    Review of Systems  Constitutional: Negative for fever and chills.  Respiratory: Negative for cough and shortness of breath.   Skin: Positive for rash.  All other systems reviewed and are negative.     Allergies  Doxycycline  Home Medications   Prior to Admission medications   Medication Sig Start Date End Date Taking? Authorizing Provider  diphenhydrAMINE (BENADRYL) 2 % cream Apply 1 application topically 3 (three) times daily as needed for itching (eczema).   Yes Historical Provider, MD  diphenhydrAMINE (BENADRYL) 25 MG tablet Take 1 tablet (25 mg total) by mouth every 6 (six) hours. 04/24/15  Yes Arby BarretteMarcy Pfeiffer, MD  naproxen sodium (ANAPROX) 220 MG tablet Take 220 mg by mouth 2 (two) times daily as needed (pain).   Yes Historical Provider, MD  TRAZODONE HCL PO Take by mouth.   Yes Historical Provider, MD  UNKNOWN TO PATIENT Antidepressant medication   Yes Historical Provider, MD  doxycycline (VIBRAMYCIN) 100 MG capsule Take 1 capsule (100 mg total) by mouth 2 (two) times daily. 04/10/15   Albertine GratesFang Xu, MD  HYDROcodone-acetaminophen (NORCO/VICODIN) 5-325 MG per tablet Take 1 tablet by mouth every 6 (six) hours as needed for moderate pain. 05/13/15   Elwin Mocha, MD  hydrocortisone ointment 0.5 % Apply 1 application topically 2 (two) times daily. 05/13/15   Elwin Mocha, MD  predniSONE (DELTASONE) 20 MG tablet 3 tabs po daily x 3 days, then 2 tabs x 3 days, then 1.5 tabs x 3 days, then 1 tab x 3 days, then 0.5 tabs x 3 days 05/13/15   Elwin Mocha, MD  Triamcinolone Acetonide (TRIAMCINOLONE 0.1 % CREAM : EUCERIN) CREA Apply 1 application topically 2 (two) times daily. 04/10/15   Albertine Grates, MD   BP 143/99 mmHg  Pulse 104  Temp(Src) 98.5 F (36.9 C)  (Oral)  Resp 16  Ht  (1.905 m)  Wt 210 lb (95.255 kg)  BMI 26.25 kg/m2  SpO2 100% Physical Exam  Constitutional: He is oriented to person, place, and time. He appears well-developed and well-nourished. No distress.  HENT:  Head: Normocephalic and atraumatic.  Mouth/Throat: No oropharyngeal exudate.  Eyes: EOM are normal. Pupils are equal, round, and reactive to light.  Neck: Normal range of motion. Neck supple.  Cardiovascular: Normal rate and regular rhythm.  Exam reveals no friction rub.   No murmur heard. Pulmonary/Chest: Effort normal and breath sounds normal. No respiratory distress. He has no wheezes. He has no rales.  Abdominal: He exhibits no distension. There is no tenderness. There is no rebound.  Musculoskeletal: Normal range of motion. He exhibits no edema.  Neurological: He is alert and oriented to person, place, and time.  Skin: Rash (Large confluent areas of non-erythematous papules on bilateral forearms, upper arms, shoulders. There are patchy but not circumferential. There are on the volar and dorsal sides of each upper extremity. The also extend up into the neck. ) noted. He is not diaphoretic.  Nursing note and vitals reviewed.   ED Course  Procedures (including critical care time) Labs Review Labs Reviewed - No data to display  Imaging Review No results found.   EKG Interpretation None      MDM   Final diagnoses:  Eczema    39 year old male with history of eczema presents with eczematous rash on bilateral forearms and up into the neck. History of same. Recently had an admission for left arm cellulitis was put on prednisone. After finishing a prednisone the rash worsened. He states this is one of the worst flares of his eczema he's had. Denies any nausea, vomiting, fever. He has no signs of cellulitis today. He does have diffuse patchy papules on the bilateral upper strength remedies. It extends into the neck also. Will treat with sterile taper began.  Given PCP follow-up so he can get a referral to dermatology.     Elwin Mocha, MD 05/13/15 [phone]

## 2015-06-02 ENCOUNTER — Encounter (HOSPITAL_BASED_OUTPATIENT_CLINIC_OR_DEPARTMENT_OTHER): Payer: Self-pay | Admitting: *Deleted

## 2015-06-02 ENCOUNTER — Emergency Department (HOSPITAL_BASED_OUTPATIENT_CLINIC_OR_DEPARTMENT_OTHER)
Admission: EM | Admit: 2015-06-02 | Discharge: 2015-06-02 | Disposition: A | Discharge status: Home / Self Care | Payer: Self-pay | Attending: Emergency Medicine | Admitting: Emergency Medicine

## 2015-06-02 DIAGNOSIS — F418 Other specified anxiety disorders: Secondary | ICD-10-CM | POA: Insufficient documentation

## 2015-06-02 DIAGNOSIS — Z79899 Other long term (current) drug therapy: Secondary | ICD-10-CM | POA: Insufficient documentation

## 2015-06-02 DIAGNOSIS — L309 Dermatitis, unspecified: Secondary | ICD-10-CM | POA: Insufficient documentation

## 2015-06-02 DIAGNOSIS — Z7952 Long term (current) use of systemic steroids: Secondary | ICD-10-CM | POA: Insufficient documentation

## 2015-06-02 DIAGNOSIS — Z720 Tobacco use: Secondary | ICD-10-CM | POA: Insufficient documentation

## 2015-06-02 MED ORDER — TRIAMCINOLONE ACETONIDE 0.025 % EX OINT: 1.0000 "application " | TOPICAL_OINTMENT | Freq: Two times a day (BID) | CUTANEOUS | Status: DC

## 2015-06-02 MED ORDER — TRAMADOL HCL 50 MG PO TABS: 50.0000 mg | ORAL_TABLET | Freq: Four times a day (QID) | ORAL | Status: DC | PRN

## 2015-06-02 NOTE — ED Notes (Signed)
Generalized body rash after mowing yard several days ago.  History eczema and was recently treated with oral steroids. States his skin had completely cleared after treatment.

## 2015-06-02 NOTE — Discharge Instructions (Signed)
Eczema Eczema, also called atopic dermatitis, is a skin disorder that causes inflammation of the skin. It causes a red rash and dry, scaly skin. The skin becomes very itchy. Eczema is generally worse during the cooler winter months and often improves with the warmth of summer. Eczema usually starts showing signs in infancy. Some children outgrow eczema, but it may last through adulthood.  CAUSES  The exact cause of eczema is not known, but it appears to run in families. People with eczema often have a family history of eczema, allergies, asthma, or hay fever. Eczema is not contagious. Flare-ups of the condition may be caused by:   Contact with something you are sensitive or allergic to.   Stress. SIGNS AND SYMPTOMS  Dry, scaly skin.   Red, itchy rash.   Itchiness. This may occur before the skin rash and may be very intense.  DIAGNOSIS  The diagnosis of eczema is usually made based on symptoms and medical history. TREATMENT  Eczema cannot be cured, but symptoms usually can be controlled with treatment and other strategies. A treatment plan might include:  Controlling the itching and scratching.   Use over-the-counter antihistamines as directed for itching. This is especially useful at night when the itching tends to be worse.   Use over-the-counter steroid creams as directed for itching.   Avoid scratching. Scratching makes the rash and itching worse. It may also result in a skin infection (impetigo) due to a break in the skin caused by scratching.   Keeping the skin well moisturized with creams every day. This will seal in moisture and help prevent dryness. Lotions that contain alcohol and water should be avoided because they can dry the skin.   Limiting exposure to things that you are sensitive or allergic to (allergens).   Recognizing situations that cause stress.   Developing a plan to manage stress.  HOME CARE INSTRUCTIONS   Only take over-the-counter or  prescription medicines as directed by your health care provider.   Do not use anything on the skin without checking with your health care provider.   Keep baths or showers short (5 minutes) in warm (not hot) water. Use mild cleansers for bathing. These should be unscented. You may add nonperfumed bath oil to the bath water. It is best to avoid soap and bubble bath.   Immediately after a bath or shower, when the skin is still damp, apply a moisturizing ointment to the entire body. This ointment should be a petroleum ointment. This will seal in moisture and help prevent dryness. The thicker the ointment, the better. These should be unscented.   Keep fingernails cut short. Children with eczema may need to wear soft gloves or mittens at night after applying an ointment.   Dress in clothes made of cotton or cotton blends. Dress lightly, because heat increases itching.   A child with eczema should stay away from anyone with fever blisters or cold sores. The virus that causes fever blisters (herpes simplex) can cause a serious skin infection in children with eczema. SEEK MEDICAL CARE IF:   Your itching interferes with sleep.   Your rash gets worse or is not better within 1 week after starting treatment.   You see pus or soft yellow scabs in the rash area.   You have a fever.   You have a rash flare-up after contact with someone who has fever blisters.  Document Released: 10/13/2000 Document Revised: 08/06/2013 Document Reviewed: 05/19/2013 [employer] Patient Information 2015 [employer]. This information   is not intended to replace advice given to you by your health care provider. Make sure you discuss any questions you have with your health care provider.  

## 2015-06-02 NOTE — ED Provider Notes (Signed)
CSN: [id number]     Arrival date & time 06/02/15  0751 History   First MD Initiated Contact with Patient 06/02/15 [phone]     Chief Complaint  Patient presents with  . Rash     (Consider location/radiation/quality/duration/timing/severity/associated sxs/prior Treatment) HPI Comments: Patient presents with eczema rash. He complains of rash and itching throughout his arms bilaterally and his upper chest and neck area. He has a history of eczema and states that this is similar to his past flareups. He's been treated with steroid tapers twice in July. The most recent was July 14. He states the steroids tend cleared up but then it comes back after he stops the steroids. He denies any fevers. He denies any nausea or vomiting.  Patient is a 39 y.o. male presenting with rash.  Rash Associated symptoms: no abdominal pain, no diarrhea, no fatigue, no fever, no headaches, no joint pain, no nausea, no shortness of breath and not vomiting     Past Medical History  Diagnosis Date  . Eczema   . Back pain   . Depression with anxiety   . PTSD (post-traumatic stress disorder)    Past Surgical History  Procedure Laterality Date  . Knee surgery     Family History  Problem Relation Age of Onset  . Hypertension Mother   . Hypertension Father   . Hyperlipidemia Neg Hx   . Heart attack Neg Hx   . Diabetes Neg Hx   . Sudden death Neg Hx    History  Substance Use Topics  . Smoking status: Current Every Day Smoker -- 1.00 packs/day    Types: Cigarettes  . Smokeless tobacco: Never Used  . Alcohol Use: Yes     Comment: occ    Review of Systems  Constitutional: Negative for fever, chills, diaphoresis and fatigue.  HENT: Negative for congestion, rhinorrhea and sneezing.   Eyes: Negative.   Respiratory: Negative for cough, chest tightness and shortness of breath.   Cardiovascular: Negative for chest pain and leg swelling.  Gastrointestinal: Negative for nausea, vomiting, abdominal pain, diarrhea and  blood in stool.  Genitourinary: Negative for frequency, hematuria, flank pain and difficulty urinating.  Musculoskeletal: Negative for back pain and arthralgias.  Skin: Positive for rash.  Neurological: Negative for dizziness, speech difficulty, weakness, numbness and headaches.      Allergies  Doxycycline  Home Medications   Prior to Admission medications   Medication Sig Start Date End Date Taking? Authorizing Provider  diphenhydrAMINE (BENADRYL) 25 MG tablet Take 1 tablet (25 mg total) by mouth every 6 (six) hours. 04/24/15  Yes Arby Barrette, MD  HYDROcodone-acetaminophen (NORCO/VICODIN) 5-325 MG per tablet Take 1 tablet by mouth every 6 (six) hours as needed for moderate pain. 05/13/15  Yes Elwin Mocha, MD  hydrocortisone ointment 0.5 % Apply 1 application topically 2 (two) times daily. 05/13/15  Yes Elwin Mocha, MD  predniSONE (DELTASONE) 20 MG tablet 3 tabs po daily x 3 days, then 2 tabs x 3 days, then 1.5 tabs x 3 days, then 1 tab x 3 days, then 0.5 tabs x 3 days 05/13/15  Yes Elwin Mocha, MD  diphenhydrAMINE (BENADRYL) 2 % cream Apply 1 application topically 3 (three) times daily as needed for itching (eczema).    Historical Provider, MD  doxycycline (VIBRAMYCIN) 100 MG capsule Take 1 capsule (100 mg total) by mouth 2 (two) times daily. 04/10/15   Albertine Grates, MD  naproxen sodium (ANAPROX) 220 MG tablet Take 220 mg by mouth 2 (two) times  daily as needed (pain).    Historical Provider, MD  traMADol (ULTRAM) 50 MG tablet Take 1 tablet (50 mg total) by mouth every 6 (six) hours as needed. 06/02/15   Rolan Bucco, MD  TRAZODONE HCL PO Take by mouth.    Historical Provider, MD  triamcinolone (KENALOG) 0.025 % ointment Apply 1 application topically 2 (two) times daily. 06/02/15   Rolan Bucco, MD  Triamcinolone Acetonide (TRIAMCINOLONE 0.1 % CREAM : EUCERIN) CREA Apply 1 application topically 2 (two) times daily. 04/10/15   Albertine Grates, MD  UNKNOWN TO PATIENT Antidepressant medication    Historical  Provider, MD   BP 139/90 mmHg  Pulse 112  Temp(Src) 98.4 F (36.9 C) (Oral)  Resp 20  SpO2 100% Physical Exam  Constitutional: He is oriented to person, place, and time. He appears well-developed and well-nourished.  HENT:  Head: Normocephalic and atraumatic.  Eyes: Pupils are equal, round, and reactive to light.  Neck: Normal range of motion. Neck supple.  Cardiovascular: Normal rate, regular rhythm and normal heart sounds.   Pulmonary/Chest: Effort normal and breath sounds normal. No respiratory distress. He has no wheezes. He has no rales. He exhibits no tenderness.  Abdominal: Soft. Bowel sounds are normal. There is no tenderness. There is no rebound and no guarding.  Musculoskeletal: Normal range of motion. He exhibits no edema.  Lymphadenopathy:    He has no cervical adenopathy.  Neurological: He is alert and oriented to person, place, and time.  Skin: Skin is warm and dry. Rash noted.  Patient has a diffuse raised scaly papular erythematous rash throughout the upper extremities bilaterally and small patches to the upper chest and neck. There is no petechiae or purpura. No vesicles. No signs of overlying infection.  Psychiatric: He has a normal mood and affect.    ED Course  Procedures (including critical care time) Labs Review Labs Reviewed - No data to display  Imaging Review No results found.   EKG Interpretation None      MDM   Final diagnoses:  Eczema    Rash appears to be consistent with eczema. There is no evidence of overlying infection. I did advise him that since he's been on oral steroids twice in the last month I felt that be better to do steroid cream. I gave him a prescription for triamcinolone ointment 0.025%. He states that he has numbers of different dermatologists that he is going to try to make an appointment with for follow-up. He is advised return here if he has worsening symptoms. I also advised the patient to use Aquaphor for  moisturizing.    Rolan Bucco, MD 06/02/15 [phone]

## 2015-07-02 ENCOUNTER — Emergency Department (HOSPITAL_BASED_OUTPATIENT_CLINIC_OR_DEPARTMENT_OTHER)
Admission: EM | Admit: 2015-07-02 | Discharge: 2015-07-02 | Disposition: A | Discharge status: Home / Self Care | Payer: Self-pay | Attending: Emergency Medicine | Admitting: Emergency Medicine

## 2015-07-02 ENCOUNTER — Emergency Department (HOSPITAL_BASED_OUTPATIENT_CLINIC_OR_DEPARTMENT_OTHER): Payer: Self-pay

## 2015-07-02 ENCOUNTER — Encounter (HOSPITAL_BASED_OUTPATIENT_CLINIC_OR_DEPARTMENT_OTHER): Payer: Self-pay

## 2015-07-02 DIAGNOSIS — R000 Tachycardia, unspecified: Secondary | ICD-10-CM | POA: Insufficient documentation

## 2015-07-02 DIAGNOSIS — Z792 Long term (current) use of antibiotics: Secondary | ICD-10-CM | POA: Insufficient documentation

## 2015-07-02 DIAGNOSIS — Z720 Tobacco use: Secondary | ICD-10-CM | POA: Insufficient documentation

## 2015-07-02 DIAGNOSIS — L309 Dermatitis, unspecified: Secondary | ICD-10-CM | POA: Insufficient documentation

## 2015-07-02 DIAGNOSIS — R0602 Shortness of breath: Secondary | ICD-10-CM | POA: Insufficient documentation

## 2015-07-02 DIAGNOSIS — Z7951 Long term (current) use of inhaled steroids: Secondary | ICD-10-CM | POA: Insufficient documentation

## 2015-07-02 DIAGNOSIS — F329 Major depressive disorder, single episode, unspecified: Secondary | ICD-10-CM | POA: Insufficient documentation

## 2015-07-02 DIAGNOSIS — M7989 Other specified soft tissue disorders: Secondary | ICD-10-CM | POA: Insufficient documentation

## 2015-07-02 DIAGNOSIS — F419 Anxiety disorder, unspecified: Secondary | ICD-10-CM | POA: Insufficient documentation

## 2015-07-02 LAB — URINE MICROSCOPIC-ADD ON

## 2015-07-02 LAB — COMPREHENSIVE METABOLIC PANEL
ALT: 18 U/L (ref 17–63)
ANION GAP: 9 (ref 5–15)
AST: 24 U/L (ref 15–41)
Albumin: 3.6 g/dL (ref 3.5–5.0)
Alkaline Phosphatase: 60 U/L (ref 38–126)
BUN: 11 mg/dL (ref 6–20)
CHLORIDE: 107 mmol/L (ref 101–111)
CO2: 26 mmol/L (ref 22–32)
Calcium: 8.6 mg/dL — ABNORMAL LOW (ref 8.9–10.3)
Creatinine, Ser: 0.78 mg/dL (ref 0.61–1.24)
GFR calc Af Amer: >60 mL/min (ref >60)
Glucose, Bld: 118 mg/dL — ABNORMAL HIGH (ref 65–99)
Potassium: 4.1 mmol/L (ref 3.5–5.1)
Sodium: 142 mmol/L (ref 135–145)
Total Bilirubin: 0.4 mg/dL (ref 0.3–1.2)
Total Protein: 6.7 g/dL (ref 6.5–8.1)

## 2015-07-02 LAB — URINALYSIS, ROUTINE W REFLEX MICROSCOPIC
BILIRUBIN URINE: NEGATIVE
Glucose, UA: NEGATIVE mg/dL
HGB URINE DIPSTICK: NEGATIVE
KETONES UR: 15 mg/dL — AB
Nitrite: NEGATIVE
Protein, ur: NEGATIVE mg/dL
Specific Gravity, Urine: 1.024 (ref 1.005–1.030)
UROBILINOGEN UA: 1 mg/dL (ref 0.0–1.0)
pH: 6.5 (ref 5.0–8.0)

## 2015-07-02 LAB — CBC WITH DIFFERENTIAL/PLATELET
BASOS ABS: 0 10*3/uL (ref 0.0–0.1)
Basophils Relative: 0 % (ref 0–1)
EOS PCT: 4 % (ref 0–5)
Eosinophils Absolute: 0.4 10*3/uL (ref 0.0–0.7)
HCT: 43.8 % (ref 39.0–52.0)
Hemoglobin: 14.5 g/dL (ref 13.0–17.0)
LYMPHS PCT: 18 % (ref 12–46)
Lymphs Abs: 1.6 10*3/uL (ref 0.7–4.0)
MCH: 32.6 pg (ref 26.0–34.0)
MCHC: 33.1 g/dL (ref 30.0–36.0)
MCV: 98.4 fL (ref 78.0–100.0)
MONO ABS: 0.9 10*3/uL (ref 0.1–1.0)
MONOS PCT: 10 % (ref 3–12)
Neutro Abs: 6.4 10*3/uL (ref 1.7–7.7)
Neutrophils Relative %: 68 % (ref 43–77)
PLATELETS: 305 10*3/uL (ref 150–400)
RBC: 4.45 MIL/uL (ref 4.22–5.81)
RDW: 12.8 % (ref 11.5–15.5)
WBC: 9.3 10*3/uL (ref 4.0–10.5)

## 2015-07-02 LAB — TROPONIN I

## 2015-07-02 LAB — BRAIN NATRIURETIC PEPTIDE: B Natriuretic Peptide: 11.8 pg/mL (ref 0.0–100.0)

## 2015-07-02 MED ORDER — HYDROMORPHONE HCL 1 MG/ML IJ SOLN
1.0000 mg | Freq: Once | INTRAMUSCULAR | Status: AC
  Administered 2015-07-02: 1 mg via INTRAVENOUS
  Filled 2015-07-02: qty 1

## 2015-07-02 MED ORDER — PREDNISONE 20 MG PO TABS: 60.0000 mg | ORAL_TABLET | Freq: Every day | ORAL | Status: DC

## 2015-07-02 MED ORDER — CLINDAMYCIN HCL 300 MG PO CAPS: 300.0000 mg | ORAL_CAPSULE | Freq: Four times a day (QID) | ORAL | Status: DC

## 2015-07-02 MED ORDER — CARRINGTON MOISTURE BARRIER EX CREA: TOPICAL_CREAM | CUTANEOUS | Status: DC | PRN

## 2015-07-02 NOTE — Discharge Instructions (Signed)

## 2015-07-02 NOTE — ED Notes (Signed)
Pt reports bilateral lower extremity edema x3 days.

## 2015-07-02 NOTE — ED Notes (Signed)
MD at bedside. 

## 2015-07-02 NOTE — ED Notes (Signed)
Patient sitting in chair, eating a bag of chips. States "I am just ready to go, I am hungry and I am just...." This RN provided information about [place] and delay of care, also asked if the patient wanted to speak with the EDP. Pt denied.

## 2015-07-02 NOTE — ED Notes (Signed)
MD at bedside.to discuss. Pt understands

## 2015-07-02 NOTE — ED Provider Notes (Signed)
CSN: [id number]     Arrival date & time 07/02/15  1324 History   First MD Initiated Contact with Patient 07/02/15 1007     Chief Complaint  Patient presents with  . Leg Swelling     (Consider location/radiation/quality/duration/timing/severity/associated sxs/prior Treatment) Patient is a 39 y.o. male presenting with rash.  Rash Location:  Full body (worst in legs) Quality: painful, redness and weeping   Pain details:    Quality:  Aching   Severity:  Moderate   Onset quality:  Gradual   Duration:  3 days   Timing:  Constant   Progression:  Worsening Severity:  Moderate Onset quality:  Gradual Timing:  Constant Progression:  Worsening Chronicity:  Chronic Context: not chemical exposure, not exposure to similar rash, not medications (no new), not new detergent/soap and not sick contacts   Context comment:  Chronic diffuse rash, worsening in legs and now with swelling Relieved by:  Nothing Worsened by:  Nothing tried Associated symptoms: shortness of breath (intermittent exertional, not new since onset of rash)   Associated symptoms: no abdominal pain and no fever     Past Medical History  Diagnosis Date  . Eczema   . Back pain   . Depression with anxiety   . PTSD (post-traumatic stress disorder)    Past Surgical History  Procedure Laterality Date  . Knee surgery     Family History  Problem Relation Age of Onset  . Hypertension Mother   . Hypertension Father   . Hyperlipidemia Neg Hx   . Heart attack Neg Hx   . Diabetes Neg Hx   . Sudden death Neg Hx    Social History  Substance Use Topics  . Smoking status: Current Every Day Smoker -- 1.00 packs/day    Types: Cigarettes  . Smokeless tobacco: Never Used  . Alcohol Use: Yes     Comment: occ    Review of Systems  Constitutional: Negative for fever.  Respiratory: Positive for shortness of breath (intermittent exertional, not new since onset of rash).   Gastrointestinal: Negative for abdominal pain.  Skin:  Positive for rash.  All other systems reviewed and are negative.     Allergies  Doxycycline  Home Medications   Prior to Admission medications   Medication Sig Start Date End Date Taking? Authorizing Provider  LAMOTRIGINE PO Take by mouth.   Yes Historical Provider, MD  TRAZODONE HCL PO Take by mouth.   Yes Historical Provider, MD  clindamycin (CLEOCIN) 300 MG capsule Take 1 capsule (300 mg total) by mouth 4 (four) times daily. X 7 days 07/02/15   Mirian Mo, MD  diphenhydrAMINE (BENADRYL) 2 % cream Apply 1 application topically 3 (three) times daily as needed for itching (eczema).    Historical Provider, MD  diphenhydrAMINE (BENADRYL) 25 MG tablet Take 1 tablet (25 mg total) by mouth every 6 (six) hours. 04/24/15   Arby Barrette, MD  doxycycline (VIBRAMYCIN) 100 MG capsule Take 1 capsule (100 mg total) by mouth 2 (two) times daily. 04/10/15   Albertine Grates, MD  HYDROcodone-acetaminophen (NORCO/VICODIN) 5-325 MG per tablet Take 1 tablet by mouth every 6 (six) hours as needed for moderate pain. 05/13/15   Elwin Mocha, MD  hydrocortisone ointment 0.5 % Apply 1 application topically 2 (two) times daily. 05/13/15   Elwin Mocha, MD  naproxen sodium (ANAPROX) 220 MG tablet Take 220 mg by mouth 2 (two) times daily as needed (pain).    Historical Provider, MD  predniSONE (DELTASONE) 20 MG tablet Take  3 tablets (60 mg total) by mouth daily. 07/02/15   Mirian Mo, MD  Skin Protectants, Misc. (EUCERIN) cream Apply topically as needed for wound care. 07/02/15   Mirian Mo, MD  traMADol (ULTRAM) 50 MG tablet Take 1 tablet (50 mg total) by mouth every 6 (six) hours as needed. 06/02/15   Rolan Bucco, MD  triamcinolone (KENALOG) 0.025 % ointment Apply 1 application topically 2 (two) times daily. 06/02/15   Rolan Bucco, MD  Triamcinolone Acetonide (TRIAMCINOLONE 0.1 % CREAM : EUCERIN) CREA Apply 1 application topically 2 (two) times daily. 04/10/15   Albertine Grates, MD  UNKNOWN TO PATIENT Antidepressant medication     Historical Provider, MD   BP 168/98 mmHg  Pulse 126  Temp(Src) 98.2 F (36.8 C) (Oral)  Resp 18  Ht  (1.905 m)  Wt 205 lb (92.987 kg)  BMI 25.62 kg/m2  SpO2 99% Physical Exam  Constitutional: He is oriented to person, place, and time. He appears well-developed and well-nourished.  HENT:  Head: Normocephalic and atraumatic.  Eyes: Conjunctivae and EOM are normal.  Neck: Normal range of motion. Neck supple.  Cardiovascular: Regular rhythm and normal heart sounds.  Tachycardia present.   Pulmonary/Chest: Effort normal and breath sounds normal. No respiratory distress.  Abdominal: He exhibits no distension. There is no tenderness. There is no rebound and no guarding.  Musculoskeletal: Normal range of motion.  Neurological: He is alert and oriented to person, place, and time.  Skin: Skin is warm and dry.  Diffuse hyperpigmented skin thickening.  Legs 1+ nonpitting edema bil with erythema and weeping, no obvious vesicles or bullae  Vitals reviewed.   ED Course  Procedures (including critical care time) Labs Review Labs Reviewed  COMPREHENSIVE METABOLIC PANEL - Abnormal; Notable for the following:    Glucose, Bld 118 (*)    Calcium 8.6 (*)    All other components within normal limits  URINALYSIS, ROUTINE W REFLEX MICROSCOPIC (NOT AT [facility]) - Abnormal; Notable for the following:    Color, Urine AMBER (*)    Ketones, ur 15 (*)    Leukocytes, UA TRACE (*)    All other components within normal limits  URINE MICROSCOPIC-ADD ON - Abnormal; Notable for the following:    Squamous Epithelial / LPF FEW (*)    Bacteria, UA FEW (*)    All other components within normal limits  CBC WITH DIFFERENTIAL/PLATELET  TROPONIN I  BRAIN NATRIURETIC PEPTIDE    Imaging Review Dg Chest 2 View  07/02/2015   CLINICAL DATA:  Shortness of breath and bilateral lower extremity swelling today.  EXAM: CHEST  2 VIEW  COMPARISON:  CT chest 04/07/2015.  FINDINGS: The lungs are clear. Heart size is  normal. No pneumothorax or pleural effusion. No focal bony abnormality.  IMPRESSION: Negative chest.   Electronically Signed   By: Drusilla Kanner M.D.   On: 07/02/2015 11:08   US Venous Img Lower Bilateral  07/02/2015   CLINICAL DATA:  Bilateral lower extremity edema and pain.  EXAM: BILATERAL LOWER EXTREMITY VENOUS DOPPLER ULTRASOUND  TECHNIQUE: Gray-scale sonography with graded compression, as well as color Doppler and duplex ultrasound were performed to evaluate the lower extremity deep venous systems from the level of the common femoral vein and including the common femoral, femoral, profunda femoral, popliteal and calf veins including the posterior tibial, peroneal and gastrocnemius veins when visible. The superficial great saphenous vein was also interrogated. Spectral Doppler was utilized to evaluate flow at rest and with distal augmentation maneuvers in  the common femoral, femoral and popliteal veins.  COMPARISON:  None.  FINDINGS: RIGHT LOWER EXTREMITY  Common Femoral Vein: No evidence of thrombus. Normal compressibility, respiratory phasicity and response to augmentation.  Saphenofemoral Junction: No evidence of thrombus. Normal compressibility and flow on color Doppler imaging.  Profunda Femoral Vein: No evidence of thrombus. Normal compressibility and flow on color Doppler imaging.  Femoral Vein: No evidence of thrombus. Normal compressibility, respiratory phasicity and response to augmentation.  Popliteal Vein: No evidence of thrombus. Normal compressibility, respiratory phasicity and response to augmentation.  Calf Veins: No evidence of thrombus. Normal compressibility and flow on color Doppler imaging.  Superficial Great Saphenous Vein: No evidence of thrombus. Normal compressibility and flow on color Doppler imaging.  Venous Reflux:  None.  Other Findings: No evidence of superficial thrombophlebitis or abnormal fluid collection.  LEFT LOWER EXTREMITY  Common Femoral Vein: No evidence of thrombus.  Normal compressibility, respiratory phasicity and response to augmentation.  Saphenofemoral Junction: No evidence of thrombus. Normal compressibility and flow on color Doppler imaging.  Profunda Femoral Vein: No evidence of thrombus. Normal compressibility and flow on color Doppler imaging.  Femoral Vein: No evidence of thrombus. Normal compressibility, respiratory phasicity and response to augmentation.  Popliteal Vein: No evidence of thrombus. Normal compressibility, respiratory phasicity and response to augmentation.  Calf Veins: No evidence of thrombus. Normal compressibility and flow on color Doppler imaging.  Superficial Great Saphenous Vein: No evidence of thrombus. Normal compressibility and flow on color Doppler imaging.  Venous Reflux:  None.  Other Findings: No evidence of superficial thrombophlebitis or abnormal fluid collection.  IMPRESSION: No evidence of bilateral lower extremity deep venous thrombosis. Inguinal lymph nodes were identified bilaterally which do not show overt enlargement based on short axis measurements. These may be reactive lymph nodes.   Electronically Signed   By: Irish Lack M.D.   On: 07/02/2015 13:45   I have personally reviewed and evaluated these images and lab results as part of my medical decision-making.   EKG Interpretation   Date/Time:  Friday July 02 2015 10:36:55 EDT Ventricular Rate:  107 PR Interval:  142 QRS Duration: 80 QT Interval:  334 QTC Calculation: 445 R Axis:   64 Text Interpretation:  Sinus tachycardia Otherwise normal ECG No old  tracing to compare Confirmed by Mirian Mo [phone]) on 07/02/2015  12:12:02 PM      MDM   Final diagnoses:  Swelling of lower extremity  Eczema    39 y.o. male with pertinent PMH of eczema, diffuse rash presents with acute on chronic rash.  Pt has been seen multiple times for same, with multiple steroid courses and partial relief/remission, however the rash has been constant for some time.  No  recent surgeries, immobilization, or other new risk factors.  Pt developed swelling in his lower extremities 3 days ago per his report.  On arrival today vitals and physical exam as above.  Pt tachycardic.  Although the pt has erythema of his bil le, it appears that this is due to his constant rubbing of the skin.  Not warm, and no fever.  He states he had just scrubbed off the skin of his legs in the shower as they were dry.  He does have mild edema to bil le.    Wu as above obtained and unremarkable.  Unknown etiology of symptoms, however doubt DVT, frank cellulitis, or arterial occlusion (2+ pulses bil).  I had a detailed discussion re: the pt's persistent tachycardia and offered CT PE  study (had prior +Ddimer of unknown etiology with negative subsequent wu).  Pt refused.  Given that he has no chest pain, is not actively short of breath, and has not had any cough, consider PE unlikely.  Tachycardia likely anxiety mediated, and pt frankly states he is frustrated and stressed.  DC home in stable condition to fu with pcp.  Prednisone course, clindamycin, and eucerin cream prescribed.    I have reviewed all laboratory and imaging studies if ordered as above  1. Swelling of lower extremity   2. Leg swelling   3. Eczema         Mirian Mo, MD 07/02/15 [phone]

## 2015-07-14 ENCOUNTER — Emergency Department (HOSPITAL_BASED_OUTPATIENT_CLINIC_OR_DEPARTMENT_OTHER)
Admission: EM | Admit: 2015-07-14 | Discharge: 2015-07-14 | Disposition: A | Discharge status: Home / Self Care | Payer: Self-pay | Attending: Emergency Medicine | Admitting: Emergency Medicine

## 2015-07-14 ENCOUNTER — Encounter (HOSPITAL_BASED_OUTPATIENT_CLINIC_OR_DEPARTMENT_OTHER): Payer: Self-pay | Admitting: *Deleted

## 2015-07-14 DIAGNOSIS — F4310 Post-traumatic stress disorder, unspecified: Secondary | ICD-10-CM | POA: Insufficient documentation

## 2015-07-14 DIAGNOSIS — Z872 Personal history of diseases of the skin and subcutaneous tissue: Secondary | ICD-10-CM | POA: Insufficient documentation

## 2015-07-14 DIAGNOSIS — Z7952 Long term (current) use of systemic steroids: Secondary | ICD-10-CM | POA: Insufficient documentation

## 2015-07-14 DIAGNOSIS — Z720 Tobacco use: Secondary | ICD-10-CM | POA: Insufficient documentation

## 2015-07-14 DIAGNOSIS — Z792 Long term (current) use of antibiotics: Secondary | ICD-10-CM | POA: Insufficient documentation

## 2015-07-14 DIAGNOSIS — M7989 Other specified soft tissue disorders: Secondary | ICD-10-CM | POA: Insufficient documentation

## 2015-07-14 DIAGNOSIS — F418 Other specified anxiety disorders: Secondary | ICD-10-CM | POA: Insufficient documentation

## 2015-07-14 DIAGNOSIS — R21 Rash and other nonspecific skin eruption: Secondary | ICD-10-CM | POA: Insufficient documentation

## 2015-07-14 MED ORDER — PREDNISONE 20 MG PO TABS: 60.0000 mg | ORAL_TABLET | Freq: Every day | ORAL | Status: DC

## 2015-07-14 NOTE — ED Provider Notes (Signed)
CSN: [id number]     Arrival date & time 07/14/15  0856 History   First MD Initiated Contact with Patient 07/14/15 [phone]     Chief Complaint  Patient presents with  . Foot Swelling     (Consider location/radiation/quality/duration/timing/severity/associated sxs/prior Treatment) The history is provided by the patient.   patient presents with a rash. History same with several visits for it. Reported eczema. States it flared up after being in the [employer]. Has had previous admissions for the same one that was cellulitis. No fevers. Rash has gotten worse after being seen about a month ago and started on oral steroids. No fevers. States he's not been able to get a primary care doctor. No chest pain or trouble breathing. Has had some drainage out of his feet.  Past Medical History  Diagnosis Date  . Eczema   . Back pain   . Depression with anxiety   . PTSD (post-traumatic stress disorder)    Past Surgical History  Procedure Laterality Date  . Knee surgery     Family History  Problem Relation Age of Onset  . Hypertension Mother   . Hypertension Father   . Hyperlipidemia Neg Hx   . Heart attack Neg Hx   . Diabetes Neg Hx   . Sudden death Neg Hx    Social History  Substance Use Topics  . Smoking status: Current Every Day Smoker -- 1.00 packs/day    Types: Cigarettes  . Smokeless tobacco: Never Used  . Alcohol Use: Yes     Comment: occ    Review of Systems  Constitutional: Negative for diaphoresis and appetite change.  Respiratory: Negative for shortness of breath.   Cardiovascular: Negative for chest pain.  Gastrointestinal: Negative for abdominal pain.  Genitourinary: Negative for flank pain.  Musculoskeletal: Negative for back pain.  Skin: Positive for rash and wound.  Neurological: Negative for seizures and numbness.      Allergies  Doxycycline  Home Medications   Prior to Admission medications   Medication Sig Start Date End Date Taking? Authorizing Provider   clindamycin (CLEOCIN) 300 MG capsule Take 1 capsule (300 mg total) by mouth 4 (four) times daily. X 7 days 07/02/15   Mirian Mo, MD  diphenhydrAMINE (BENADRYL) 2 % cream Apply 1 application topically 3 (three) times daily as needed for itching (eczema).    Historical Provider, MD  diphenhydrAMINE (BENADRYL) 25 MG tablet Take 1 tablet (25 mg total) by mouth every 6 (six) hours. 04/24/15   Arby Barrette, MD  doxycycline (VIBRAMYCIN) 100 MG capsule Take 1 capsule (100 mg total) by mouth 2 (two) times daily. 04/10/15   Albertine Grates, MD  HYDROcodone-acetaminophen (NORCO/VICODIN) 5-325 MG per tablet Take 1 tablet by mouth every 6 (six) hours as needed for moderate pain. 05/13/15   Elwin Mocha, MD  hydrocortisone ointment 0.5 % Apply 1 application topically 2 (two) times daily. 05/13/15   Elwin Mocha, MD  LAMOTRIGINE PO Take by mouth.    Historical Provider, MD  naproxen sodium (ANAPROX) 220 MG tablet Take 220 mg by mouth 2 (two) times daily as needed (pain).    Historical Provider, MD  predniSONE (DELTASONE) 20 MG tablet Take 3 tablets (60 mg total) by mouth daily. 07/14/15   Benjiman Core, MD  Skin Protectants, Misc. (EUCERIN) cream Apply topically as needed for wound care. 07/02/15   Mirian Mo, MD  traMADol (ULTRAM) 50 MG tablet Take 1 tablet (50 mg total) by mouth every 6 (six) hours as needed. 06/02/15  Rolan Bucco, MD  TRAZODONE HCL PO Take by mouth.    Historical Provider, MD  triamcinolone (KENALOG) 0.025 % ointment Apply 1 application topically 2 (two) times daily. 06/02/15   Rolan Bucco, MD  Triamcinolone Acetonide (TRIAMCINOLONE 0.1 % CREAM : EUCERIN) CREA Apply 1 application topically 2 (two) times daily. 04/10/15   Albertine Grates, MD  UNKNOWN TO PATIENT Antidepressant medication    Historical Provider, MD   BP 157/96 mmHg  Pulse 100  Temp(Src) 98 F (36.7 C) (Oral)  Resp 103  Ht 6\' 3"  (1.905 m)  Wt 205 lb (92.987 kg)  BMI 25.62 kg/m2  SpO2 100% Physical Exam  Constitutional: He appears  well-developed.  Cardiovascular: Normal rate.   Pulmonary/Chest: Effort normal.  Abdominal: Soft. There is no tenderness.  Neurological: He is alert.  Skin: Rash noted.  Papular rash on upper chest trunk and extremities. Some slight weeping on his feet. No frank edema.rash involves palms of his hands also.    ED Course  Procedures (including critical care time) Labs Review Labs Reviewed  RPR    Imaging Review No results found. I have personally reviewed and evaluated these images and lab results as part of my medical decision-making.   EKG Interpretation None      MDM   Final diagnoses:  Rash    Patient rash. Has been seen for same several times. May have allergies or eczema. Has been treated for same with serous. Has had previous workup but RPR is not been drawn. That added patient be discharged home. Will need to find a primary care doctor.    Benjiman Core, MD 07/14/15 [phone]

## 2015-07-14 NOTE — ED Notes (Signed)
MD at bedside. 

## 2015-07-14 NOTE — ED Notes (Addendum)
Pt sts he was seen here on Sept. 2 for swelling in his feet and hands. He sts he was dx witch eczema and given some rx's to help. He sts the meds helped but he ran out and now the eczema is flaring back up. Pt requesting refill of meds.

## 2015-07-14 NOTE — Discharge Instructions (Signed)

## 2015-07-15 LAB — RPR: RPR: NONREACTIVE

## 2015-08-19 ENCOUNTER — Encounter: Payer: Self-pay | Admitting: Family Medicine

## 2015-08-19 ENCOUNTER — Ambulatory Visit (INDEPENDENT_AMBULATORY_CARE_PROVIDER_SITE_OTHER): Payer: Self-pay | Admitting: Family Medicine

## 2015-08-19 VITALS — BP 136/87 | HR 102 | Temp 98.2°F | Ht 75.0 in | Wt 202.0 lb

## 2015-08-19 DIAGNOSIS — R21 Rash and other nonspecific skin eruption: Secondary | ICD-10-CM

## 2015-08-19 DIAGNOSIS — Z7689 Persons encountering health services in other specified circumstances: Secondary | ICD-10-CM

## 2015-08-19 DIAGNOSIS — Z7189 Other specified counseling: Secondary | ICD-10-CM

## 2015-08-19 MED ORDER — HYDROCORTISONE 1 % EX OINT: 1.0000 "application " | TOPICAL_OINTMENT | Freq: Two times a day (BID) | CUTANEOUS | Status: DC

## 2015-08-19 MED ORDER — CLOBETASOL PROPIONATE 0.05 % EX SOLN: 1.0000 "application " | Freq: Two times a day (BID) | CUTANEOUS | Status: DC

## 2015-08-19 MED ORDER — GABAPENTIN 100 MG PO CAPS: 100.0000 mg | ORAL_CAPSULE | Freq: Three times a day (TID) | ORAL | Status: DC

## 2015-08-19 MED ORDER — SULFAMETHOXAZOLE-TRIMETHOPRIM 800-160 MG PO TABS: 1.0000 | ORAL_TABLET | Freq: Two times a day (BID) | ORAL | Status: DC
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
## 2015-11-09 ENCOUNTER — Other Ambulatory Visit: Payer: Self-pay | Admitting: Family Medicine

## 2015-11-09 MED ORDER — CLOBETASOL PROPIONATE 0.05 % EX SOLN: 1.0000 "application " | Freq: Two times a day (BID) | CUTANEOUS | Status: DC

## 2015-11-09 MED FILL — CLOBETASOL 0.05% SOLUTION: 0.05 | 15 days supply | Qty: 50 | Fill #0

## 2015-11-09 NOTE — Telephone Encounter (Signed)
Refill sent into pharmacy. Thanks!  

## 2015-12-10 ENCOUNTER — Encounter: Payer: Self-pay | Admitting: Family Medicine

## 2015-12-10 ENCOUNTER — Ambulatory Visit (INDEPENDENT_AMBULATORY_CARE_PROVIDER_SITE_OTHER): Payer: Self-pay | Admitting: Family Medicine

## 2015-12-10 VITALS — BP 159/100 | HR 105 | Temp 98.2°F | Resp 16 | Ht 75.0 in | Wt 171.0 lb

## 2015-12-10 DIAGNOSIS — M5441 Lumbago with sciatica, right side: Secondary | ICD-10-CM

## 2015-12-10 DIAGNOSIS — I10 Essential (primary) hypertension: Secondary | ICD-10-CM

## 2015-12-10 MED ORDER — MELOXICAM 15 MG PO TABS: 15.0000 mg | ORAL_TABLET | Freq: Every day | ORAL | Status: DC

## 2015-12-10 MED FILL — MELOXICAM 15 MG TABLET: 15 | 30 days supply | Qty: 30 | Fill #0

## 2015-12-10 MED FILL — CLOBETASOL 0.05% SOLUTION: 0.05 | 15 days supply | Qty: 50 | Fill #1

## 2015-12-10 NOTE — Patient Instructions (Signed)
I will look through your records to find any information relating to your back and leg pain that I can send to the disability people. Will let you know when those records are available.
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
## 2016-02-03 MED FILL — CLOBETASOL 0.05% SOLUTION: 0.05 | 15 days supply | Qty: 50 | Fill #2

## 2016-03-10 ENCOUNTER — Other Ambulatory Visit: Payer: Self-pay | Admitting: *Deleted

## 2016-03-10 DIAGNOSIS — Z872 Personal history of diseases of the skin and subcutaneous tissue: Secondary | ICD-10-CM

## 2016-03-10 MED ORDER — CLOBETASOL PROPIONATE 0.05 % EX SOLN: 1.0000 "application " | Freq: Two times a day (BID) | CUTANEOUS | Status: DC

## 2016-03-10 MED FILL — CLOBETASOL 0.05% SOLUTION: 0.05 | 25 days supply | Qty: 50 | Fill #0

## 2016-03-10 NOTE — Telephone Encounter (Signed)
Patients external solution was refilled with 2 additional [facility].

## 2016-04-07 ENCOUNTER — Encounter: Payer: Self-pay | Admitting: Family Medicine

## 2016-04-07 ENCOUNTER — Ambulatory Visit (INDEPENDENT_AMBULATORY_CARE_PROVIDER_SITE_OTHER): Payer: Self-pay | Admitting: Family Medicine

## 2016-04-07 VITALS — BP 130/92 | HR 120 | Temp 98.0°F | Resp 20 | Ht 75.0 in | Wt 163.0 lb

## 2016-04-07 DIAGNOSIS — I10 Essential (primary) hypertension: Secondary | ICD-10-CM

## 2016-04-07 DIAGNOSIS — M5441 Lumbago with sciatica, right side: Secondary | ICD-10-CM

## 2016-04-07 MED ORDER — PREDNISONE 20 MG PO TABS: ORAL_TABLET | ORAL | Status: DC

## 2016-04-07 NOTE — Progress Notes (Signed)
Patient ID: Cory Reese, male   DOB: 1976/02/08, 40 y.o.   MRN: [id number]   Cory SentersDarrell Reese, is a 40 y.o. male  [id number]  [id number]  DOB - 1976/02/08  CC:  Chief Complaint  Patient presents with  . Follow-up  . Back Pain       HPI: Cory Reese is a 40 y.o. male presents for continued back pain. He reports pain in neck, shoulders, upper and lower back. He has tried Meloxicam and naproxen in the past, which he reports does not help. He does not have insurance coverage and cannot afford for us to do imaring studies or refer to ortho. He does report having some pain down leg with some tingling.  Allergies  Allergen Reactions  . Doxycycline Rash   Past Medical History  Diagnosis Date  . Eczema   . Back pain   . Depression with anxiety   . PTSD (post-traumatic stress disorder)    Current Outpatient Prescriptions on File Prior to Visit  Medication Sig Dispense Refill  . clobetasol (TEMOVATE) 0.05 % external solution Apply 1 application topically 2 (two) times daily. 50 mL 2  . clindamycin (CLEOCIN) 300 MG capsule Take 1 capsule (300 mg total) by mouth 4 (four) times daily. X 7 days (Patient not taking: Reported on 08/19/2015) 28 capsule 0  . diphenhydrAMINE (BENADRYL) 2 % cream Apply 1 application topically 3 (three) times daily as needed for itching (eczema). Reported on 04/07/2016    . diphenhydrAMINE (BENADRYL) 25 MG tablet Take 1 tablet (25 mg total) by mouth every 6 (six) hours. (Patient not taking: Reported on 08/19/2015) 20 tablet 0  . doxycycline (VIBRAMYCIN) 100 MG capsule Take 1 capsule (100 mg total) by mouth 2 (two) times daily. (Patient not taking: Reported on 08/19/2015) 20 capsule 0  . gabapentin (NEURONTIN) 100 MG capsule Take 1 capsule (100 mg total) by mouth 3 (three) times daily. (Patient not taking: Reported on 04/07/2016) 90 capsule 3  . HYDROcodone-acetaminophen (NORCO/VICODIN) 5-325 MG per tablet Take 1 tablet by mouth every 6 (six) hours as needed for  moderate pain. (Patient not taking: Reported on 08/19/2015) 12 tablet 0  . hydrocortisone 1 % ointment Apply 1 application topically 2 (two) times daily. (Patient not taking: Reported on 12/10/2015) 430 g 2  . hydrocortisone ointment 0.5 % Apply 1 application topically 2 (two) times daily. (Patient not taking: Reported on 08/19/2015) 30 g 0  . LAMOTRIGINE PO Take by mouth.    . meloxicam (MOBIC) 15 MG tablet Take 1 tablet (15 mg total) by mouth daily. (Patient not taking: Reported on 04/07/2016) 30 tablet 1  . naproxen sodium (ANAPROX) 220 MG tablet Take 220 mg by mouth 2 (two) times daily as needed (pain). Reported on 04/07/2016    . Skin Protectants, Misc. (EUCERIN) cream Apply topically as needed for wound care. (Patient not taking: Reported on 08/19/2015) 397 g 0  . sulfamethoxazole-trimethoprim (BACTRIM DS,SEPTRA DS) 800-160 MG tablet Take 1 tablet by mouth 2 (two) times daily. (Patient not taking: Reported on 12/10/2015) 20 tablet 0  . traMADol (ULTRAM) 50 MG tablet Take 1 tablet (50 mg total) by mouth every 6 (six) hours as needed. (Patient not taking: Reported on 08/19/2015) 15 tablet 0  . TRAZODONE HCL PO Take by mouth. Reported on 04/07/2016    . triamcinolone (KENALOG) 0.025 % ointment Apply 1 application topically 2 (two) times daily. (Patient not taking: Reported on 08/19/2015) 80 g 0  . Triamcinolone Acetonide (TRIAMCINOLONE 0.1 % CREAM : EUCERIN)  CREA Apply 1 application topically 2 (two) times daily. (Patient not taking: Reported on 08/19/2015) 1 each 1  . UNKNOWN TO PATIENT Reported on 12/10/2015     No current facility-administered medications on file prior to visit.   Family History  Problem Relation Age of Onset  . Hypertension Mother   . Hypertension Father   . Hyperlipidemia Neg Hx   . Heart attack Neg Hx   . Diabetes Neg Hx   . Sudden death Neg Hx    Social History   Social History  . Marital Status: Single    Spouse Name: N/A  . Number of Children: N/A  . Years of  Education: N/A   Occupational History  . Not on file.   Social History Main Topics  . Smoking status: Current Every Day Smoker -- 1.00 packs/day    Types: Cigarettes  . Smokeless tobacco: Never Used  . Alcohol Use: Yes     Comment: occ  . Drug Use: No  . Sexual Activity: Not on file   Other Topics Concern  . Not on file   Social History Narrative    Review of Systems: Constitutional: Negative for fever, chills, appetite change, weight loss,  Fatigue. Skin: Negative for rashes or lesions of concern. HENT: Negative for ear pain, ear discharge.nose bleeds Eyes: Negative for pain, discharge, redness, itching and visual disturbance. Neck: Negative for pain, stiffness Respiratory: Negative for cough, shortness of breath,   Cardiovascular: Negative for chest pain, palpitations and leg swelling. Gastrointestinal: Negative for abdominal pain, nausea, vomiting, diarrhea, constipations Genitourinary: Negative for dysuria, urgency, frequency, hematuria,  Musculoskeletal: Positive for back pain. Neurological: Negative for dizziness, tremors, seizures, syncope,   light-headedness, numbness and headaches.  Hematological: Negative for easy bruising or bleeding Psychiatric/Behavioral: Negative for depression, anxiety, decreased concentration, confusion   Objective:   Filed Vitals:   04/07/16 1330  BP: 130/92  Pulse: 120  Temp: 98 F (36.7 C)  Resp: 20    Physical Exam: Constitutional: Patient appears well-developed and well-nourished. No distress. HENT: Normocephalic, atraumatic, External right and left ear normal. Oropharynx is clear and moist.  Eyes: Conjunctivae and EOM are normal. PERRLA, no scleral icterus. Neck: Normal ROM. Neck supple. No lymphadenopathy, No thyromegaly. CVS: RRR, S1/S2 +, no murmurs, no gallops, no rubs Pulmonary: Effort and breath sounds normal, no stridor, rhonchi, wheezes, rales.  Abdominal: Soft. Normoactive BS,, no distension, tenderness, rebound or  guarding.  Musculoskeletal: Normal range of motion. No edema and no tenderness.  Neuro: Alert.Normal muscle tone coordination. Non-focal Skin: Skin is warm and dry. No rash noted. Not diaphoretic. No erythema. No pallor. Psychiatric: Normal mood and affect. Behavior, judgment, thought content normal.  Lab Results  Component Value Date   WBC 9.3 07/02/2015   HGB 14.5 07/02/2015   HCT 43.8 07/02/2015   MCV 98.4 07/02/2015   PLT 305 07/02/2015   Lab Results  Component Value Date   CREATININE 0.78 07/02/2015   BUN 11 07/02/2015   NA 142 07/02/2015   K 4.1 07/02/2015   CL 107 07/02/2015   CO2 26 07/02/2015    No results found for: HGBA1C Lipid Panel  No results found for: CHOL, TRIG, HDL, CHOLHDL, VLDL, LDLCALC     Assessment and plan:   1. Low back pain with right-sided sciatica, unspecified back pain laterality -Prednisone 20 mg, # 12, 3 a day for 2 days, 2 a day for 2 days, then one a day for 2 days.  2. Essential hypertension -Patient declines medication  at this time. BP is 130/92. -Will get rechecked in 1-2 months.   Follow-up 1-2 months with nurse for BP check.  The patient was given clear instructions to go to ER or return to medical center if symptoms don't improve, worsen or new problems develop. The patient verbalized understanding.    Henrietta Hoover FNP  04/07/2016, 1:56 PM

## 2016-04-07 NOTE — Patient Instructions (Signed)
Come back for nurse to check blood pressure in one to two months. If this prednisone helps your back it can be repeated in about 3 months.

## 2016-04-09 ENCOUNTER — Emergency Department (HOSPITAL_COMMUNITY): Payer: Self-pay

## 2016-04-09 ENCOUNTER — Encounter (HOSPITAL_COMMUNITY): Payer: Self-pay | Admitting: Emergency Medicine

## 2016-04-09 ENCOUNTER — Inpatient Hospital Stay (HOSPITAL_COMMUNITY)
Admission: EM | Admit: 2016-04-09 | Discharge: 2016-04-13 | DRG: 101 | Disposition: A | Discharge status: Home / Self Care | Payer: Self-pay | Attending: Internal Medicine | Admitting: Internal Medicine

## 2016-04-09 DIAGNOSIS — M5441 Lumbago with sciatica, right side: Secondary | ICD-10-CM | POA: Diagnosis present

## 2016-04-09 DIAGNOSIS — F17210 Nicotine dependence, cigarettes, uncomplicated: Secondary | ICD-10-CM | POA: Diagnosis present

## 2016-04-09 DIAGNOSIS — R569 Unspecified convulsions: Secondary | ICD-10-CM | POA: Insufficient documentation

## 2016-04-09 DIAGNOSIS — F4310 Post-traumatic stress disorder, unspecified: Secondary | ICD-10-CM | POA: Diagnosis present

## 2016-04-09 DIAGNOSIS — R740 Nonspecific elevation of levels of transaminase and lactic acid dehydrogenase [LDH]: Secondary | ICD-10-CM | POA: Diagnosis present

## 2016-04-09 DIAGNOSIS — F418 Other specified anxiety disorders: Secondary | ICD-10-CM | POA: Diagnosis present

## 2016-04-09 DIAGNOSIS — G40409 Other generalized epilepsy and epileptic syndromes, not intractable, without status epilepticus: Principal | ICD-10-CM | POA: Diagnosis present

## 2016-04-09 DIAGNOSIS — F10231 Alcohol dependence with withdrawal delirium: Secondary | ICD-10-CM | POA: Diagnosis present

## 2016-04-09 DIAGNOSIS — D649 Anemia, unspecified: Secondary | ICD-10-CM | POA: Diagnosis present

## 2016-04-09 DIAGNOSIS — D6959 Other secondary thrombocytopenia: Secondary | ICD-10-CM | POA: Diagnosis present

## 2016-04-09 DIAGNOSIS — F1010 Alcohol abuse, uncomplicated: Secondary | ICD-10-CM | POA: Diagnosis present

## 2016-04-09 DIAGNOSIS — Z8249 Family history of ischemic heart disease and other diseases of the circulatory system: Secondary | ICD-10-CM

## 2016-04-09 DIAGNOSIS — E876 Hypokalemia: Secondary | ICD-10-CM | POA: Diagnosis present

## 2016-04-09 DIAGNOSIS — Z881 Allergy status to other antibiotic agents status: Secondary | ICD-10-CM

## 2016-04-09 DIAGNOSIS — R7401 Elevation of levels of liver transaminase levels: Secondary | ICD-10-CM | POA: Diagnosis present

## 2016-04-09 DIAGNOSIS — Z79899 Other long term (current) drug therapy: Secondary | ICD-10-CM

## 2016-04-09 DIAGNOSIS — R29898 Other symptoms and signs involving the musculoskeletal system: Secondary | ICD-10-CM | POA: Diagnosis present

## 2016-04-09 DIAGNOSIS — F10931 Alcohol use, unspecified with withdrawal delirium: Secondary | ICD-10-CM | POA: Diagnosis present

## 2016-04-09 DIAGNOSIS — D696 Thrombocytopenia, unspecified: Secondary | ICD-10-CM | POA: Diagnosis present

## 2016-04-09 LAB — COMPREHENSIVE METABOLIC PANEL
ALT: 74 U/L — AB (ref 17–63)
AST: 145 U/L — AB (ref 15–41)
Albumin: 3.6 g/dL (ref 3.5–5.0)
Alkaline Phosphatase: 90 U/L (ref 38–126)
Anion gap: 22 — ABNORMAL HIGH (ref 5–15)
BILIRUBIN TOTAL: 1.3 mg/dL — AB (ref 0.3–1.2)
BUN: 5 mg/dL — AB (ref 6–20)
CO2: 18 mmol/L — ABNORMAL LOW (ref 22–32)
CREATININE: 0.86 mg/dL (ref 0.61–1.24)
Calcium: 8.7 mg/dL — ABNORMAL LOW (ref 8.9–10.3)
Chloride: 98 mmol/L — ABNORMAL LOW (ref 101–111)
GFR calc Af Amer: >60 mL/min (ref >60)
Glucose, Bld: 107 mg/dL — ABNORMAL HIGH (ref 65–99)
Potassium: 3.7 mmol/L (ref 3.5–5.1)
Sodium: 138 mmol/L (ref 135–145)
TOTAL PROTEIN: 6.7 g/dL (ref 6.5–8.1)

## 2016-04-09 LAB — CBC WITH DIFFERENTIAL/PLATELET
BASOS ABS: 0 10*3/uL (ref 0.0–0.1)
Basophils Relative: 1 %
Eosinophils Absolute: 0 10*3/uL (ref 0.0–0.7)
Eosinophils Relative: 0 %
HEMATOCRIT: 37.6 % — AB (ref 39.0–52.0)
Hemoglobin: 12.5 g/dL — ABNORMAL LOW (ref 13.0–17.0)
LYMPHS PCT: 12 %
Lymphs Abs: 0.5 10*3/uL — ABNORMAL LOW (ref 0.7–4.0)
MCH: 32.2 pg (ref 26.0–34.0)
MCHC: 33.2 g/dL (ref 30.0–36.0)
MCV: 96.9 fL (ref 78.0–100.0)
MONO ABS: 0.5 10*3/uL (ref 0.1–1.0)
Monocytes Relative: 13 %
NEUTROS ABS: 2.9 10*3/uL (ref 1.7–7.7)
Neutrophils Relative %: 75 %
Platelets: 114 10*3/uL — ABNORMAL LOW (ref 150–400)
RBC: 3.88 MIL/uL — AB (ref 4.22–5.81)
RDW: 12.2 % (ref 11.5–15.5)
WBC: 3.9 10*3/uL — ABNORMAL LOW (ref 4.0–10.5)

## 2016-04-09 LAB — CBG MONITORING, ED: Glucose-Capillary: 99 mg/dL (ref 65–99)

## 2016-04-09 LAB — PHOSPHORUS: Phosphorus: 2.7 mg/dL (ref 2.5–4.6)

## 2016-04-09 LAB — ETHANOL

## 2016-04-09 LAB — MAGNESIUM: MAGNESIUM: 1.3 mg/dL — AB (ref 1.7–2.4)

## 2016-04-09 MED ORDER — ADULT MULTIVITAMIN W/MINERALS CH
1.0000 | ORAL_TABLET | Freq: Every day | ORAL | Status: DC
  Administered 2016-04-10 – 2016-04-13 (×4): 1 via ORAL
  Filled 2016-04-09 (×4): qty 1

## 2016-04-09 MED ORDER — LORAZEPAM 1 MG PO TABS
1.0000 mg | ORAL_TABLET | Freq: Once | ORAL | Status: AC
  Administered 2016-04-09: 1 mg via ORAL
  Filled 2016-04-09: qty 1

## 2016-04-09 MED ORDER — FOLIC ACID 1 MG PO TABS
1.0000 mg | ORAL_TABLET | Freq: Every day | ORAL | Status: DC
  Administered 2016-04-10 – 2016-04-13 (×4): 1 mg via ORAL
  Filled 2016-04-09 (×4): qty 1

## 2016-04-09 MED ORDER — LORAZEPAM 1 MG PO TABS
1.0000 mg | ORAL_TABLET | Freq: Four times a day (QID) | ORAL | Status: AC | PRN
  Filled 2016-04-09 (×2): qty 1

## 2016-04-09 MED ORDER — LORAZEPAM 1 MG PO TABS
0.0000 mg | ORAL_TABLET | Freq: Four times a day (QID) | ORAL | Status: AC
  Administered 2016-04-10 (×4): 1 mg via ORAL
  Administered 2016-04-11 (×2): 2 mg via ORAL
  Filled 2016-04-09 (×2): qty 1
  Filled 2016-04-09: qty 2
  Filled 2016-04-09: qty 1

## 2016-04-09 MED ORDER — ENOXAPARIN SODIUM 40 MG/0.4ML ~~LOC~~ SOLN
40.0000 mg | Freq: Every day | SUBCUTANEOUS | Status: DC
Start: 2016-04-10 — End: 2016-04-13
  Administered 2016-04-10 – 2016-04-11 (×2): 40 mg via SUBCUTANEOUS
  Filled 2016-04-09 (×5): qty 0.4

## 2016-04-09 MED ORDER — PROMETHAZINE HCL 25 MG PO TABS
12.5000 mg | ORAL_TABLET | Freq: Four times a day (QID) | ORAL | Status: DC | PRN
  Administered 2016-04-10: 12.5 mg via ORAL
  Filled 2016-04-09: qty 1

## 2016-04-09 MED ORDER — LORAZEPAM 1 MG PO TABS
0.0000 mg | ORAL_TABLET | Freq: Two times a day (BID) | ORAL | Status: DC
  Administered 2016-04-13: 1 mg via ORAL
  Filled 2016-04-09: qty 2

## 2016-04-09 MED ORDER — SENNOSIDES-DOCUSATE SODIUM 8.6-50 MG PO TABS
1.0000 | ORAL_TABLET | Freq: Every evening | ORAL | Status: DC | PRN
  Filled 2016-04-09: qty 1

## 2016-04-09 MED ORDER — VITAMIN B-1 100 MG PO TABS
100.0000 mg | ORAL_TABLET | Freq: Every day | ORAL | Status: DC
  Administered 2016-04-10 – 2016-04-13 (×4): 100 mg via ORAL
  Filled 2016-04-09 (×4): qty 1

## 2016-04-09 MED ORDER — LORAZEPAM 2 MG/ML IJ SOLN: 1.0000 mg | Freq: Four times a day (QID) | INTRAMUSCULAR | Status: AC | PRN

## 2016-04-09 NOTE — ED Provider Notes (Signed)
CSN: [id number]     Arrival date & time 04/09/16  2125 History   First MD Initiated Contact with Patient 04/09/16 2126     Chief Complaint  Patient presents with  . Seizures   HPI  Pt arrives via EMS from home with c/o seizure witnessed by family, per EMS report lasted about 1-2 minutes, full body shaking, foaming at the mouth. Started on the couch, family moved patient to the couch so no fall involved. EMS CBG 95. Pt noted to be hypertensive 190/100 initially with hx of the same, came down to 150/100 by the time of arrival to ED. No oral trauma noted, no incontinence noted. Patient recently has been under increased stress but stopped drinking alcohol yesterday. Patient typically drinks between 3 and 4 glasses of vodka a day. He usually wakes up with shaking but after drinking his alcohol this resolves. His father recently died one month ago and has been under increased stress the result of this. Per girlfriend at bedside, patient has been acting normal today all thought she does state she has noticed shaking of his extremities which she usually only notices in the morning when he hasn't drank alcohol. No recent head trauma. No fevers or chills. No focal deficits. The seizure itself was nonfocal, 2 minutes, the patient was postictal after the event. He did not get any medications administered by EMS.  Past Medical History  Diagnosis Date  . Eczema   . Back pain   . Depression with anxiety   . PTSD (post-traumatic stress disorder)    Past Surgical History  Procedure Laterality Date  . Knee surgery Left    Family History  Problem Relation Age of Onset  . Hypertension Mother   . Hypertension Father   . Hyperlipidemia Neg Hx   . Heart attack Neg Hx   . Diabetes Neg Hx   . Sudden death Neg Hx    Social History  Substance Use Topics  . Smoking status: Current Every Day Smoker -- 1.00 packs/day    Types: Cigarettes  . Smokeless tobacco: Never Used  . Alcohol Use: Yes     Comment: occ     Review of Systems  Constitutional: Negative for fever.  HENT: Negative for dental problem and ear discharge.   Eyes: Negative for photophobia.  Respiratory: Negative for chest tightness and shortness of breath.   Allergic/Immunologic: Negative for immunocompromised state.  Neurological: Positive for tremors.  Psychiatric/Behavioral: Positive for confusion.  All other systems reviewed and are negative.     Allergies  Doxycycline  Home Medications   Prior to Admission medications   Medication Sig Start Date End Date Taking? Authorizing Provider  clobetasol (TEMOVATE) 0.05 % external solution Apply 1 application topically 2 (two) times daily. 03/10/16  Yes Henrietta Hoover, NP  Multiple Vitamin (MULTIVITAMIN WITH MINERALS) TABS tablet Take 1 tablet by mouth daily.   Yes Historical Provider, MD  naproxen sodium (ANAPROX) 220 MG tablet Take 220 mg by mouth 2 (two) times daily as needed (pain). Reported on 04/07/2016   Yes Historical Provider, MD  clindamycin (CLEOCIN) 300 MG capsule Take 1 capsule (300 mg total) by mouth 4 (four) times daily. X 7 days Patient not taking: Reported on 08/19/2015 07/02/15   Mirian Mo, MD  diphenhydrAMINE (BENADRYL) 25 MG tablet Take 1 tablet (25 mg total) by mouth every 6 (six) hours. Patient not taking: Reported on 08/19/2015 04/24/15   Arby Barrette, MD  doxycycline (VIBRAMYCIN) 100 MG capsule Take 1 capsule (100  mg total) by mouth 2 (two) times daily. Patient not taking: Reported on 08/19/2015 04/10/15   Albertine GratesFang Xu, MD  gabapentin (NEURONTIN) 100 MG capsule Take 1 capsule (100 mg total) by mouth 3 (three) times daily. Patient not taking: Reported on 04/07/2016 08/19/15   Henrietta HooverLinda C Bernhardt, NP  HYDROcodone-acetaminophen (NORCO/VICODIN) 5-325 MG per tablet Take 1 tablet by mouth every 6 (six) hours as needed for moderate pain. Patient not taking: Reported on 08/19/2015 05/13/15   Elwin MochaBlair Walden, MD  hydrocortisone 1 % ointment Apply 1 application topically 2  (two) times daily. Patient not taking: Reported on 12/10/2015 08/19/15   Henrietta HooverLinda C Bernhardt, NP  hydrocortisone ointment 0.5 % Apply 1 application topically 2 (two) times daily. Patient not taking: Reported on 08/19/2015 05/13/15   Elwin MochaBlair Walden, MD  meloxicam (MOBIC) 15 MG tablet Take 1 tablet (15 mg total) by mouth daily. Patient not taking: Reported on 04/07/2016 12/10/15   Henrietta HooverLinda C Bernhardt, NP  predniSONE (DELTASONE) 20 MG tablet Take 3 a day for 2 days, 2 a day for 2 days, then one a day for 2 days. Patient not taking: Reported on 04/09/2016 04/07/16   Henrietta HooverLinda C Bernhardt, NP  Skin Protectants, Misc. (EUCERIN) cream Apply topically as needed for wound care. Patient not taking: Reported on 08/19/2015 07/02/15   Mirian MoMatthew Gentry, MD  sulfamethoxazole-trimethoprim (BACTRIM DS,SEPTRA DS) 800-160 MG tablet Take 1 tablet by mouth 2 (two) times daily. Patient not taking: Reported on 12/10/2015 08/19/15   Henrietta HooverLinda C Bernhardt, NP  traMADol (ULTRAM) 50 MG tablet Take 1 tablet (50 mg total) by mouth every 6 (six) hours as needed. Patient not taking: Reported on 08/19/2015 06/02/15   Rolan BuccoMelanie Belfi, MD  triamcinolone (KENALOG) 0.025 % ointment Apply 1 application topically 2 (two) times daily. Patient not taking: Reported on 08/19/2015 06/02/15   Rolan BuccoMelanie Belfi, MD  Triamcinolone Acetonide (TRIAMCINOLONE 0.1 % CREAM : EUCERIN) CREA Apply 1 application topically 2 (two) times daily. Patient not taking: Reported on 08/19/2015 04/10/15   Albertine GratesFang Xu, MD   BP 158/106 mmHg  Pulse 101  Temp(Src) 99.8 F (37.7 C) (Rectal)  Resp 21  Ht 6\' 3"  (1.905 m)  Wt 67.132 kg  BMI 18.50 kg/m2  SpO2 97% Physical Exam  Constitutional: He is oriented to person, place, and time. He appears well-developed and well-nourished. No distress.  HENT:  Head: Normocephalic and atraumatic.  Left Ear: External ear normal.  Eyes: Conjunctivae are normal. Pupils are equal, round, and reactive to light. Right eye exhibits no discharge. Left eye exhibits  no discharge.  Neck: Normal range of motion. Neck supple.  Cardiovascular: Regular rhythm.   No murmur heard. Tachycardic  Pulmonary/Chest: Effort normal and breath sounds normal. No respiratory distress.  Abdominal: Soft. Bowel sounds are normal. He exhibits no distension and no mass. There is no tenderness. There is no rebound and no guarding.  Musculoskeletal: He exhibits no edema.  No tenderness along the face, scalp, C-spine, T-spine, L-spine, all 4 extremities, chest back abdomen or pelvis.  Neurological: He is alert and oriented to person, place, and time. No cranial nerve deficit. He exhibits normal muscle tone. Coordination normal.  Oriented 3 but unable to recall certain things such as what medications he takes every day Tremor to hands bilaterally, unable to walk in a straight line, unable to test for Romberg 5/5 strength and sensation in all 4 extremities Normal finger-to-nose  Skin: Skin is warm. He is not diaphoretic.  Psychiatric: He has a normal mood and affect.  Nursing note and vitals reviewed.   ED Course  Procedures (including critical care time) Labs Review Labs Reviewed  CBC WITH DIFFERENTIAL/PLATELET - Abnormal; Notable for the following:    WBC 3.9 (*)    RBC 3.88 (*)    Hemoglobin 12.5 (*)    HCT 37.6 (*)    Platelets 114 (*)    Lymphs Abs 0.5 (*)    All other components within normal limits  COMPREHENSIVE METABOLIC PANEL - Abnormal; Notable for the following:    Chloride 98 (*)    CO2 18 (*)    Glucose, Bld 107 (*)    BUN 5 (*)    Calcium 8.7 (*)    AST 145 (*)    ALT 74 (*)    Total Bilirubin 1.3 (*)    Anion gap 22 (*)    All other components within normal limits  MAGNESIUM - Abnormal; Notable for the following:    Magnesium 1.3 (*)    All other components within normal limits  ETHANOL  PHOSPHORUS  URINE RAPID DRUG SCREEN, HOSP PERFORMED  CBG MONITORING, ED    Imaging Review Ct Head Wo Contrast  04/09/2016  CLINICAL DATA:  Acute onset  of seizure, with shaking and foaming at the mouth. Initial encounter. EXAM: CT HEAD WITHOUT CONTRAST TECHNIQUE: Contiguous axial images were obtained from the base of the skull through the vertex without intravenous contrast. COMPARISON:  None. FINDINGS: There is no evidence of acute infarction, mass lesion, or intra- or extra-axial hemorrhage on CT. The posterior fossa, including the cerebellum, brainstem and fourth ventricle, is within normal limits. The third and lateral ventricles, and basal ganglia are unremarkable in appearance. The cerebral hemispheres are symmetric in appearance, with normal gray-white differentiation. No mass effect or midline shift is seen. There is no evidence of fracture; visualized osseous structures are unremarkable in appearance. The visualized portions of the orbits are within normal limits. The paranasal sinuses and mastoid air cells are well-aerated. No significant soft tissue abnormalities are seen. IMPRESSION: Unremarkable noncontrast CT of the head. Electronically Signed   By: Roanna Raider M.D.   On: 04/09/2016 22:55   I have personally reviewed and evaluated these images and lab results as part of my medical decision-making.   EKG Interpretation   Date/Time:  Sunday April 09 2016 21:31:40 EDT Ventricular Rate:  109 PR Interval:  146 QRS Duration: 81 QT Interval:  400 QTC Calculation: 539 R Axis:   57 Text Interpretation:  Sinus tachycardia Consider left ventricular  hypertrophy Prolonged QT interval Prolonged QT Confirmed by Manus Gunning  MD,  STEPHEN [phone]) on 04/09/2016 10:29:38 PM      MDM   Final diagnoses:  Delirium tremens (HCC)  Seizure (HCC)   Patient recently had stopped drinking with a history of withdrawal with daily vodka drinking. Now with first-time seizure. Head CT without intracranial hemorrhage or mass effect. Liver enzymes suggestive of chronic alcohol use. CIWA protocol initiated, Ativan administered. Patient will be admitted. Patient  and girlfriend updated and in agreement with plan.   Sidney Ace, MD 04/10/16 6045  Glynn Octave, MD 04/10/16 [phone]

## 2016-04-09 NOTE — ED Notes (Signed)
Pt's family member called RN into room, pt had jumped to the end of the bed and was more visibly anxious - shaking, increased respiratory rate, wide eyes looking around room. States blood pressure cuff scared him.

## 2016-04-09 NOTE — ED Notes (Signed)
Prompted to provide urine specimen 

## 2016-04-09 NOTE — ED Notes (Signed)
Pt arrives via EMS from home with c/o seizure witnessed by family, per EMS report lasted about 1-2 minutes, full body shaking, foaming at the mouth. Started on the couch, family moved patient to the couch so no fall involved. EMS CBG 95. Pt noted to be hypertensive 190/100 initially with hx of the same, came down to 150/100 by the time of arrival to ED. No oral trauma noted, no incontinence noted. Pt is alert x3 at this time, difficulty remembering date.

## 2016-04-09 NOTE — H&P (Signed)
History and Physical  Cory Reese ZOX:[id number] DOB: 20-Jul-1976 DOA: 04/09/2016   PCP: Concepcion Living, NP   Patient coming from: Home   Chief Complaint: Seizure   HPI: Cory Reese is a 40 y.o. male with medical history significant for daily EtOH abuse who would like to quit, took last drink on afternoon of 6/10 and had witnessed generalized tonic clonic seizure this evening. Fiance witnessed. Denies recent chest pain, fever, nausea, vomiting. He has been somewhat confused and somnolent after the seizure. Never had a seizure before, has quit drinking for days at a time in the past but never had bad withdrawal symptoms.    ED Course: Was found in ED to have labs c/w alcohol abuse, CT head negative.   Review of Systems: Please see HPI for pertinent positives and negatives. A complete 10 system review of systems are otherwise negative.  Past Medical History  Diagnosis Date  . Eczema   . Back pain   . Depression with anxiety   . PTSD (post-traumatic stress disorder)    Past Surgical History  Procedure Laterality Date  . Knee surgery Left     Social History:  reports that he has been smoking Cigarettes.  He has been smoking about 1.00 pack per day. He has never used smokeless tobacco. He reports that he drinks alcohol. He reports that he does not use illicit drugs.   Allergies  Allergen Reactions  . Doxycycline Rash    Family History  Problem Relation Age of Onset  . Hypertension Mother   . Hypertension Father   . Hyperlipidemia Neg Hx   . Heart attack Neg Hx   . Diabetes Neg Hx   . Sudden death Neg Hx      Prior to Admission medications   Medication Sig Start Date End Date Taking? Authorizing Provider  clobetasol (TEMOVATE) 0.05 % external solution Apply 1 application topically 2 (two) times daily. 03/10/16  Yes Henrietta Hoover, NP  Multiple Vitamin (MULTIVITAMIN WITH MINERALS) TABS tablet Take 1 tablet by mouth daily.   Yes Historical Provider, MD  naproxen  sodium (ANAPROX) 220 MG tablet Take 220 mg by mouth 2 (two) times daily as needed (pain). Reported on 04/07/2016   Yes Historical Provider, MD  clindamycin (CLEOCIN) 300 MG capsule Take 1 capsule (300 mg total) by mouth 4 (four) times daily. X 7 days Patient not taking: Reported on 08/19/2015 07/02/15   Mirian Mo, MD  diphenhydrAMINE (BENADRYL) 25 MG tablet Take 1 tablet (25 mg total) by mouth every 6 (six) hours. Patient not taking: Reported on 08/19/2015 04/24/15   Arby Barrette, MD  doxycycline (VIBRAMYCIN) 100 MG capsule Take 1 capsule (100 mg total) by mouth 2 (two) times daily. Patient not taking: Reported on 08/19/2015 04/10/15   Albertine Grates, MD  gabapentin (NEURONTIN) 100 MG capsule Take 1 capsule (100 mg total) by mouth 3 (three) times daily. Patient not taking: Reported on 04/07/2016 08/19/15   Henrietta Hoover, NP  HYDROcodone-acetaminophen (NORCO/VICODIN) 5-325 MG per tablet Take 1 tablet by mouth every 6 (six) hours as needed for moderate pain. Patient not taking: Reported on 08/19/2015 05/13/15   Elwin Mocha, MD  hydrocortisone 1 % ointment Apply 1 application topically 2 (two) times daily. Patient not taking: Reported on 12/10/2015 08/19/15   Henrietta Hoover, NP  hydrocortisone ointment 0.5 % Apply 1 application topically 2 (two) times daily. Patient not taking: Reported on 08/19/2015 05/13/15   Elwin Mocha, MD  meloxicam (MOBIC) 15 MG tablet  Take 1 tablet (15 mg total) by mouth daily. Patient not taking: Reported on 04/07/2016 12/10/15   Henrietta Hoover, NP  predniSONE (DELTASONE) 20 MG tablet Take 3 a day for 2 days, 2 a day for 2 days, then one a day for 2 days. Patient not taking: Reported on 04/09/2016 04/07/16   Henrietta Hoover, NP  Skin Protectants, Misc. (EUCERIN) cream Apply topically as needed for wound care. Patient not taking: Reported on 08/19/2015 07/02/15   Mirian Mo, MD  sulfamethoxazole-trimethoprim (BACTRIM DS,SEPTRA DS) 800-160 MG tablet Take 1 tablet by mouth 2  (two) times daily. Patient not taking: Reported on 12/10/2015 08/19/15   Henrietta Hoover, NP  traMADol (ULTRAM) 50 MG tablet Take 1 tablet (50 mg total) by mouth every 6 (six) hours as needed. Patient not taking: Reported on 08/19/2015 06/02/15   Rolan Bucco, MD  triamcinolone (KENALOG) 0.025 % ointment Apply 1 application topically 2 (two) times daily. Patient not taking: Reported on 08/19/2015 06/02/15   Rolan Bucco, MD  Triamcinolone Acetonide (TRIAMCINOLONE 0.1 % CREAM : EUCERIN) CREA Apply 1 application topically 2 (two) times daily. Patient not taking: Reported on 08/19/2015 04/10/15   Albertine Grates, MD    Physical Exam: BP 158/106 mmHg  Pulse 101  Temp(Src) 99.8 F (37.7 C) (Rectal)  Resp 21  Ht  (1.905 m)  Wt 67.132 kg (148 lb)  BMI 18.50 kg/m2  SpO2 97%  General:  Alert, oriented, calm, in no acute distress, sleepy but arousable. Resting tremor. Eyes: pupils round and reactive to light and accomodation, clear sclerea Neck: supple, no masses, trachea mildline  Cardiovascular: RRR, no murmurs or rubs, no peripheral edema  Respiratory: clear to auscultation bilaterally, no wheezes, no crackles  Abdomen: soft, nontender, nondistended, normal bowel tones heard  Skin: dry, no rashes  Musculoskeletal: no joint effusions, normal range of motion  Psychiatric: appropriate affect, normal speech  Neurologic: extraocular muscles intact, clear speech, moving all extremities with intact sensorium, resting tremor, no asterixis           Labs on Admission:  Basic Metabolic Panel:  Recent Labs Lab 04/09/16 2143  NA 138  K 3.7  CL 98*  CO2 18*  GLUCOSE 107*  BUN 5*  CREATININE 0.86  CALCIUM 8.7*  MG 1.3*  PHOS 2.7   Liver Function Tests:  Recent Labs Lab 04/09/16 2143  AST 145*  ALT 74*  ALKPHOS 90  BILITOT 1.3*  PROT 6.7  ALBUMIN 3.6   No results for input(s): LIPASE, AMYLASE in the last 168 hours. No results for input(s): AMMONIA in the last 168  hours. CBC:  Recent Labs Lab 04/09/16 2143  WBC 3.9*  NEUTROABS 2.9  HGB 12.5*  HCT 37.6*  MCV 96.9  PLT 114*   Cardiac Enzymes: No results for input(s): CKTOTAL, CKMB, CKMBINDEX, TROPONINI in the last 168 hours.  BNP (last 3 results)  Recent Labs  07/02/15 1045  BNP 11.8    ProBNP (last 3 results) No results for input(s): PROBNP in the last 8760 hours.  CBG:  Recent Labs Lab 04/09/16 2136  GLUCAP 99    Radiological Exams on Admission: Ct Head Wo Contrast  04/09/2016  CLINICAL DATA:  Acute onset of seizure, with shaking and foaming at the mouth. Initial encounter. EXAM: CT HEAD WITHOUT CONTRAST TECHNIQUE: Contiguous axial images were obtained from the base of the skull through the vertex without intravenous contrast. COMPARISON:  None. FINDINGS: There is no evidence of acute infarction, mass lesion, or  intra- or extra-axial hemorrhage on CT. The posterior fossa, including the cerebellum, brainstem and fourth ventricle, is within normal limits. The third and lateral ventricles, and basal ganglia are unremarkable in appearance. The cerebral hemispheres are symmetric in appearance, with normal gray-white differentiation. No mass effect or midline shift is seen. There is no evidence of fracture; visualized osseous structures are unremarkable in appearance. The visualized portions of the orbits are within normal limits. The paranasal sinuses and mastoid air cells are well-aerated. No significant soft tissue abnormalities are seen. IMPRESSION: Unremarkable noncontrast CT of the head. Electronically Signed   By: Roanna RaiderJeffery  Chang M.D.   On: 04/09/2016 22:55      Assessment/Plan Present on Admission:  . Delirium tremens (HCC)  Delirium tremens (HCC) - admit to stepdown - scheduled and PRN IV ativan per CIWA score - thiamine, folate, MVI  Seizure  - from EtOH withdrawal, head CT negative for acute process - seizure precautions - no further workup of seizure required  EtOH  abuse - counseled to quit, with fiance at bedside - he is willing to attempt to quit completely - social work consulted for assistance with resources  Anemia - poss from EtOH, though not clearly macrocytic  AST/ALT elevated - classic EtOH pattern - will check acute hepatitis panel  DVT prophylaxis: Lovenox   Code Status: FULL   Family Communication: Fiance   Disposition Plan: Home in 2-3 days.   Consults called: None   Admission status: Inpt stepdown   Time spent: 54 minutes  Cory Dann Vergie LivingMohammed Yatzary Merriweather MD [facility] Pager [phone]  If 7PM-7AM, please contact night-coverage [email or web address] Password [email or web address]  04/09/2016, 11:59 PM

## 2016-04-09 NOTE — ED Notes (Signed)
Prompted to provide urine sample. Given more water

## 2016-04-10 ENCOUNTER — Encounter (HOSPITAL_COMMUNITY): Payer: Self-pay

## 2016-04-10 LAB — COMPREHENSIVE METABOLIC PANEL
ALK PHOS: 88 U/L (ref 38–126)
ALT: 76 U/L — ABNORMAL HIGH (ref 17–63)
ANION GAP: 11 (ref 5–15)
AST: 160 U/L — AB (ref 15–41)
Albumin: 3.5 g/dL (ref 3.5–5.0)
BILIRUBIN TOTAL: 1.8 mg/dL — AB (ref 0.3–1.2)
CALCIUM: 8.9 mg/dL (ref 8.9–10.3)
CO2: 27 mmol/L (ref 22–32)
Chloride: 99 mmol/L — ABNORMAL LOW (ref 101–111)
Creatinine, Ser: 0.74 mg/dL (ref 0.61–1.24)
GFR calc Af Amer: >60 mL/min (ref >60)
Glucose, Bld: 80 mg/dL (ref 65–99)
POTASSIUM: 2.8 mmol/L — AB (ref 3.5–5.1)
Sodium: 137 mmol/L (ref 135–145)
TOTAL PROTEIN: 6.3 g/dL — AB (ref 6.5–8.1)

## 2016-04-10 LAB — CBC
HEMATOCRIT: 36 % — AB (ref 39.0–52.0)
Hemoglobin: 12 g/dL — ABNORMAL LOW (ref 13.0–17.0)
MCH: 31.7 pg (ref 26.0–34.0)
MCHC: 33.3 g/dL (ref 30.0–36.0)
MCV: 95 fL (ref 78.0–100.0)
PLATELETS: 110 10*3/uL — AB (ref 150–400)
RBC: 3.79 MIL/uL — ABNORMAL LOW (ref 4.22–5.81)
RDW: 11.9 % (ref 11.5–15.5)
WBC: 5.8 10*3/uL (ref 4.0–10.5)

## 2016-04-10 LAB — RAPID URINE DRUG SCREEN, HOSP PERFORMED
Amphetamines: NOT DETECTED
BARBITURATES: NOT DETECTED
BENZODIAZEPINES: NOT DETECTED
Cocaine: NOT DETECTED
Opiates: NOT DETECTED
TETRAHYDROCANNABINOL: NOT DETECTED

## 2016-04-10 MED ORDER — POTASSIUM CHLORIDE CRYS ER 20 MEQ PO TBCR
40.0000 meq | EXTENDED_RELEASE_TABLET | Freq: Three times a day (TID) | ORAL | Status: AC
  Administered 2016-04-10 – 2016-04-11 (×3): 40 meq via ORAL
  Filled 2016-04-10 (×3): qty 2

## 2016-04-10 MED ORDER — PNEUMOCOCCAL VAC POLYVALENT 25 MCG/0.5ML IJ INJ
0.5000 mL | INJECTION | INTRAMUSCULAR | Status: DC
  Filled 2016-04-10: qty 0.5

## 2016-04-10 MED ORDER — CLONIDINE HCL 0.1 MG PO TABS
0.1000 mg | ORAL_TABLET | Freq: Three times a day (TID) | ORAL | Status: DC
  Administered 2016-04-10 – 2016-04-11 (×3): 0.1 mg via ORAL
  Filled 2016-04-10 (×3): qty 1

## 2016-04-10 MED ORDER — MAGNESIUM SULFATE 4 GM/100ML IV SOLN
4.0000 g | Freq: Once | INTRAVENOUS | Status: AC
  Administered 2016-04-10: 4 g via INTRAVENOUS
  Filled 2016-04-10: qty 100

## 2016-04-10 NOTE — Progress Notes (Signed)
[place] TEAM 1 - Stepdown/ICU TEAM  Cory Reese  ZOX:[id number] DOB: 07-Apr-1976 DOA: 04/09/2016 PCP: Concepcion LivingBERNHARDT, LINDA, NP    Brief Narrative:  40 y.o. male with history of daily EtOH abuse who took his last drink on the afternoon of 6/10 and was witnessed to have a generalized tonic clonic seizure 6/11 evening.  He had never had a seizure before, despite the fact that he has quit drinking for days at a time in the past.   Subjective: The patient is sluggish but conversant.  He denies chest pain shortness breath fevers chills nausea or vomiting.  There's been no report of repeat seizure activity.    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 rate 16, height 6\' 3"  (1.905 m), weight 67.132 kg (148 lb), SpO2 96 %. No intake or output data in the 24 hours ending 04/10/16 0818 Filed Weights   04/09/16 2135  Weight: 67.132 kg (148 lb)    Examination: General: No acute respiratory distress Lungs: Clear to auscultation bilaterally without wheezes or crackles Cardiovascular: Regular rate and rhythm without murmur gallop or  rub normal S1 and S2 Abdomen: Nontender, nondistended, soft, bowel sounds positive, no rebound, no ascites, no appreciable mass Extremities: No significant cyanosis, clubbing, or edema bilateral lower extremities  CBC:  Recent Labs Lab 04/09/16 2143 04/10/16 0307  WBC 3.9* 5.8  NEUTROABS 2.9  --   HGB 12.5* 12.0*  HCT 37.6* 36.0*  MCV 96.9 95.0  PLT 114* 110*   Basic Metabolic Panel:  Recent Labs Lab 04/09/16 2143 04/10/16 0307  NA 138 137  K 3.7 2.8*  CL 98* 99*  CO2 18* 27  GLUCOSE 107* 80  BUN 5* <5*  CREATININE 0.86 0.74  CALCIUM 8.7* 8.9  MG 1.3*  --   PHOS 2.7  --    GFR: Estimated Creatinine Clearance: 116.5 mL/min (by C-G formula based on Cr of 0.74).  Liver Function Tests:  Recent Labs Lab 04/09/16 2143 04/10/16 0307  AST 145* 160*  ALT 74* 76*  ALKPHOS 90 88  BILITOT 1.3* 1.8*  PROT 6.7 6.3*  ALBUMIN 3.6 3.5   CBG:  Recent Labs Lab 04/09/16 2136  GLUCAP 99    Scheduled Meds: . enoxaparin (LOVENOX) injection  40 mg Subcutaneous Daily  . folic acid  1 mg Oral Daily  . LORazepam  0-4 mg Oral Q6H   Followed by  . [START ON 04/12/2016] LORazepam  0-4 mg Oral Q12H  . multivitamin with minerals  1 tablet Oral Daily  . thiamine  100 mg Oral Daily     LOS: 1 day  Time spent: 35 minutes   Lonia Blood, MD [facility] Office  [phone] Pager - Text Page per Amion as per below:  On-Call/Text Page:      [email or web address]      password [email or web address]  If 7PM-7AM, please contact night-coverage [email or web address] Password [facility] 04/10/2016, 8:18 AM

## 2016-04-10 NOTE — ED Notes (Signed)
Pt was cleaned up after having large BM. Full bed change. Family member assisted in clean up.

## 2016-04-10 NOTE — ED Notes (Signed)
Attempted Report x1.   

## 2016-04-10 NOTE — Progress Notes (Signed)
Patient received from ED via stretcher. Patient walked to bed. Resting comfortably in bed in no apparent distress. Complaint of pain in hips and tingling in feet consistent with report received from ED. Pt oriented to room and call bell. Instructed on policies for our unit. Fiance at bedside. CHG bath performed, gown changed, and CCMD called to notify of admission.  Noe GensStefanie A Sophie Tamez, RN

## 2016-04-10 NOTE — ED Notes (Signed)
Assisted patient up to bathroom. Slightly unsteady on his feet.

## 2016-04-10 NOTE — Care Management Note (Signed)
Case Management Note  Patient Details  Name: Cory Reese MRN: [id number] Date of Birth: [birth year]  Subjective/Objective:                  Pt arrives via EMS from home with c/o seizure witnessed by family, per EMS report lasted about 1-2 minutes, full body shaking, foaming at the mouth. /Home with significant other  Action/Plan: Follow for disposition needs.   Expected Discharge Date:  04/13/16               Expected Discharge Plan:  Home/Self Care  In-House Referral:     Discharge planning Services  CM Consult, Indigent Health Clinic, Medication Assistance  Post Acute Care Choice:    Choice offered to:     DME Arranged:    DME Agency:     HH Arranged:    HH Agency:     Status of Service:  In process, will continue to follow  Medicare Important Message Given:    Date Medicare IM Given:    Medicare IM give by:    Date Additional Medicare IM Given:    Additional Medicare Important Message give by:     If discussed at Long Length of Stay Meetings, dates discussed:    Additional Comments:  Oletta CohnWood, Shafer Swamy, RN 04/10/2016, 9:21 AM

## 2016-04-11 LAB — COMPREHENSIVE METABOLIC PANEL
ALK PHOS: 86 U/L (ref 38–126)
ALT: 66 U/L — ABNORMAL HIGH (ref 17–63)
ANION GAP: 9 (ref 5–15)
AST: 110 U/L — ABNORMAL HIGH (ref 15–41)
Albumin: 3.1 g/dL — ABNORMAL LOW (ref 3.5–5.0)
BUN: 5 mg/dL — ABNORMAL LOW (ref 6–20)
CALCIUM: 8.8 mg/dL — AB (ref 8.9–10.3)
CO2: 28 mmol/L (ref 22–32)
Chloride: 100 mmol/L — ABNORMAL LOW (ref 101–111)
Creatinine, Ser: 0.72 mg/dL (ref 0.61–1.24)
GFR calc non Af Amer: >60 mL/min (ref >60)
Glucose, Bld: 91 mg/dL (ref 65–99)
Potassium: 3.3 mmol/L — ABNORMAL LOW (ref 3.5–5.1)
SODIUM: 137 mmol/L (ref 135–145)
TOTAL PROTEIN: 6.1 g/dL — AB (ref 6.5–8.1)
Total Bilirubin: 1.2 mg/dL (ref 0.3–1.2)

## 2016-04-11 LAB — CBC
HCT: 37.2 % — ABNORMAL LOW (ref 39.0–52.0)
HEMOGLOBIN: 12.3 g/dL — AB (ref 13.0–17.0)
MCH: 31.6 pg (ref 26.0–34.0)
MCHC: 33.1 g/dL (ref 30.0–36.0)
MCV: 95.6 fL (ref 78.0–100.0)
Platelets: 107 10*3/uL — ABNORMAL LOW (ref 150–400)
RBC: 3.89 MIL/uL — AB (ref 4.22–5.81)
RDW: 11.8 % (ref 11.5–15.5)
WBC: 4.6 10*3/uL (ref 4.0–10.5)

## 2016-04-11 LAB — PHOSPHORUS: PHOSPHORUS: 2.1 mg/dL — AB (ref 2.5–4.6)

## 2016-04-11 LAB — HEPATITIS PANEL, ACUTE
HEP A IGM: NEGATIVE
HEP B S AG: NEGATIVE
Hep B C IgM: NEGATIVE

## 2016-04-11 LAB — MAGNESIUM: MAGNESIUM: 2.1 mg/dL (ref 1.7–2.4)

## 2016-04-11 LAB — MRSA PCR SCREENING: MRSA by PCR: NEGATIVE

## 2016-04-11 MED ORDER — K PHOS MONO-SOD PHOS DI & MONO 155-852-130 MG PO TABS
250.0000 mg | ORAL_TABLET | Freq: Two times a day (BID) | ORAL | Status: DC
  Administered 2016-04-11 – 2016-04-13 (×5): 250 mg via ORAL
  Filled 2016-04-11 (×8): qty 1

## 2016-04-11 MED ORDER — POTASSIUM CHLORIDE CRYS ER 20 MEQ PO TBCR
40.0000 meq | EXTENDED_RELEASE_TABLET | Freq: Three times a day (TID) | ORAL | Status: AC
  Administered 2016-04-11 – 2016-04-12 (×3): 40 meq via ORAL
  Filled 2016-04-11 (×3): qty 2

## 2016-04-11 MED ORDER — CLONIDINE HCL 0.1 MG PO TABS
0.1000 mg | ORAL_TABLET | Freq: Two times a day (BID) | ORAL | Status: DC
Start: 2016-04-11 — End: 2016-04-13
  Administered 2016-04-11 – 2016-04-13 (×4): 0.1 mg via ORAL
  Filled 2016-04-11 (×4): qty 1

## 2016-04-11 NOTE — Progress Notes (Signed)
Cory MoorsDarrell N Reese [id number] Admission Data: 04/11/2016 6:27 PM Attending Provider: Lonia BloodJeffrey T McClung, MD  WUJ:[id number]:BERNHARDT, LINDA, NP Consults/ Treatment Team:    Cory Reese is a 40 y.o. male patient admitted from ED awake, alert  & orientated  X 3,  Full Code, VSS - Blood pressure 133/90, pulse 92, temperature 98.3 F (36.8 C), temperature source Oral, resp. rate 13, height 6\' 3"  (1.905 m), weight 64.411 kg (142 lb), SpO2 100 %.,  no c/o shortness of breath, no c/o chest pain, no distress noted. Tele # 16 placed and pt is currently running:normal sinus rhythm.   IV site WDL:  antecubital left, condition patent and no redness with a transparent dsg that's clean dry and intact.  Allergies:   Allergies  Allergen Reactions  . Doxycycline Rash     Past Medical History  Diagnosis Date  . Eczema   . Back pain   . Depression with anxiety   . PTSD (post-traumatic stress disorder)     History:  obtained from the patient. Tobacco/alcohol: Smoked 1 packs per day for 9 years 2 liquor drinks per day(s)  Pt orientation to unit, room and routine. Information packet given to patient/family and safety video watched.  Admission INP armband ID verified with patient/family, and in place. SR up x 2, fall risk assessment complete with Patient and family verbalizing understanding of risks associated with falls. Pt verbalizes an understanding of how to use the call bell and to call for help before getting out of bed.  Skin, clean-dry- intact without evidence of bruising, or skin tears.   No evidence of skin break down noted on exam. no rashes, no ecchymoses, no petechiae    Will cont to monitor and assist as needed.  Josephyne Tarter Consuella Loselaine, RN 04/11/2016 6:27 PM

## 2016-04-11 NOTE — Progress Notes (Signed)
[place] TEAM 1 - Stepdown/ICU TEAM  Cory Reese  OZH:[id number] DOB: 06-09-1976 DOA: 04/09/2016 PCP: Concepcion LivingBERNHARDT, LINDA, NP    Brief Narrative:  40 y.o. male with history of daily EtOH abuse who took his last drink on the afternoon of 6/10 and was witnessed to have a generalized tonic clonic seizure 6/11 evening.  He had never had a seizure before, despite the fact that he has quit drinking for days at a time in the past.   Subjective: Alert and conversant.  Pt states he is very weak and feels uncoordinated when attempting to ambulate.  Reports some intermittent tingling in his legs.  Denies seizure or tremors.  Feels less agitated.  No sob, cp, n/v.      Assessment & Plan:  Delirium tremens Cont CIWA on tapering schedule   Seizure  Due to EtOH withdrawal - head CT negative - no indication for AEDs - no recurrence   B LE weakness Check B12 and folate - begin PT  EtOH abuse counseled to quit - social work consulted for assistance with resources  Hypokalemia Due to alcohol abuse - improved but not yet at goal of 4.0 - cont to replace    Hypomagnesemia Due to alcohol abuse - replaced   Hypophosphatemia Due to alcohol abuse - mild - replace and recheck in AM   Anemia  due to alcoholism - no evidence of blood loss - Hgb stable    Transaminitis  classic EtOH pattern - acute hepatitis panel negative - LFTs improving   DVT prophylaxis: lovenox Code Status: FULL CODE Family Communication: spoke w/ fiance at bedside   Disposition Plan:  the patient appears to be tolerating his CIWA well - transfer to telemetry bed - PT eval - cont to replace lytes - check B12/folate   Consultants:  none  Procedures: none  Antimicrobials:  none   Objective: Blood pressure 143/98, pulse 80, temperature 98.3 F (36.8 C), temperature source Oral, resp. rate 13, height 6\' 3"  (1.905 m), weight 64.411 kg (142 lb), SpO2 100 %.  Intake/Output Summary (Last 24 hours) at 04/11/16  1404 Last data filed at 04/11/16 1028  Gross per 24 hour  Intake    340 ml  Output    100 ml  Net    240 ml   Filed Weights   04/09/16 2135 04/10/16 1903  Weight: 67.132 kg (148 lb) 64.411 kg (142 lb)    Examination: General: No acute respiratory distress - alert and conversant  Lungs: Clear to auscultation bilaterally - no wheezes or crackles Cardiovascular: Regular rate and rhythm without murmur gallop or rub  Abdomen: Nontender, nondistended, soft, bowel sounds positive, no rebound, no ascites, no appreciable mass Extremities: No significant cyanosis, clubbing, edema bilateral lower extremities  CBC:  Recent Labs Lab 04/09/16 2143 04/10/16 0307 04/11/16 0317  WBC 3.9* 5.8 4.6  NEUTROABS 2.9  --   --   HGB 12.5* 12.0* 12.3*  HCT 37.6* 36.0* 37.2*  MCV 96.9 95.0 95.6  PLT 114* 110* 107*   Basic Metabolic Panel:  Recent Labs Lab 04/09/16 2143 04/10/16 0307 04/11/16 0317  NA 138 137 137  K 3.7 2.8* 3.3*  CL 98* 99* 100*  CO2 18* 27 28  GLUCOSE 107* 80 91  BUN 5* <5* 5*  CREATININE 0.86 0.74 0.72  CALCIUM 8.7* 8.9 8.8*  MG 1.3*  --  2.1  PHOS 2.7  --  2.1*   GFR: Estimated Creatinine Clearance: 111.8 mL/min (by C-G formula based on  Cr of 0.72).  Liver Function Tests:  Recent Labs Lab 04/09/16 2143 04/10/16 0307 04/11/16 0317  AST 145* 160* 110*  ALT 74* 76* 66*  ALKPHOS 90 88 86  BILITOT 1.3* 1.8* 1.2  PROT 6.7 6.3* 6.1*  ALBUMIN 3.6 3.5 3.1*   CBG:  Recent Labs Lab 04/09/16 2136  GLUCAP 99    Scheduled Meds: . cloNIDine  0.1 mg Oral TID  . enoxaparin (LOVENOX) injection  40 mg Subcutaneous Daily  . folic acid  1 mg Oral Daily  . LORazepam  0-4 mg Oral Q6H   Followed by  . [START ON 04/12/2016] LORazepam  0-4 mg Oral Q12H  . multivitamin with minerals  1 tablet Oral Daily  . thiamine  100 mg Oral Daily     LOS: 2 days   Time spent: 35 minutes   Lonia Blood, MD [facility] Office  [phone] Pager - Text Page  per Amion as per below:  On-Call/Text Page:      [email or web address]      password [email or web address]  If 7PM-7AM, please contact night-coverage [email or web address] Password [facility] 04/11/2016, 2:04 PM

## 2016-04-11 NOTE — Progress Notes (Signed)
Report given to Ginger, RN on 5W. Patient to be transferred via wheelchair to 5W11. Belongings with patient including cell phone. Fiance at bedside. Patient to be transferred off telemetry but will be on telemetry on 5W. CCMD notified.   Noe GensStefanie A Ester Hilley, RN

## 2016-04-11 NOTE — Evaluation (Signed)
Physical Therapy Evaluation Patient Details Name: Cory Reese MRN: [id number] DOB: 1976/06/29 Today's Date: 04/11/2016   History of Present Illness  40 y.o. male with medical history significant for daily EtOH abuse who would like to quit, took last drink on afternoon of 6/10 and had witnessed generalized tonic clonic seizure 6/11  Significant PMHx-"sciatica" per pt report since accident 2015, PTSD, depression    Clinical Impression  Pt admitted with above diagnosis. Patient very unsteady on his feet requiring min assist to maintain balance. Legs tremble (patient reports due to sciatica?) and feet feel "weird" which is impacting his balance. Pt reports h/o falls prior to seizure. Pt currently with functional limitations due to the deficits listed below (see PT Problem List).  Pt will benefit from skilled PT to increase their independence and safety with mobility to allow discharge to the venue listed below.    Noted ? Discharge 6/14. Patient definitely needs at least one more PT session for education with RW and stair training (6 steps to enter his home).     Follow Up Recommendations Outpatient PT;Supervision for mobility/OOB (if he will qualify for OPPT visit)    Equipment Recommendations  Rolling walker with 5" wheels (will continue to assess)    Recommendations for Other Services OT consult     Precautions / Restrictions Precautions Precautions: Fall Precaution Comments: reports has fallen several times in recent past      Mobility  Bed Mobility Overal bed mobility: Modified Independent             General bed mobility comments: incr time; perseverating on cords to monitor  Transfers Overall transfer level: Needs assistance Equipment used: 1 person hand held assist Transfers: Sit to/from Stand Sit to Stand: Min assist         General transfer comment: legs trembling/shakey--pt reports that is his baseline x 2 yrs  Ambulation/Gait Ambulation/Gait  assistance: Min assist Ambulation Distance (Feet): 80 Feet Assistive device: 1 person hand held assist (occasional rail in other hand) Gait Pattern/deviations: Step-through pattern;Decreased stride length;Staggering right;Drifts right/left;Narrow base of support   Gait velocity interpretation: Below normal speed for age/gender General Gait Details: pt very hesitant due to legs trembling and feet feeling "weird" Balance improved with tremors less noticeable when pt able to incr velocity (still way below normal speed)  Stairs            Wheelchair Mobility    Modified Rankin (Stroke Patients Only)       Balance Overall balance assessment: Needs assistance;History of Falls Sitting-balance support: No upper extremity supported;Feet supported Sitting balance-Leahy Scale: Good Sitting balance - Comments: reaching to his feet and outside BOS   Standing balance support: Single extremity supported Standing balance-Leahy Scale: Poor                               Pertinent Vitals/Pain VSS on monitor in SDU  Pain Assessment: No/denies pain    Home Living Family/patient expects to be discharged to:: Private residence Living Arrangements: Spouse/significant other (fiance) Available Help at Discharge: Family;Available 24 hours/day Type of Home: House Home Access: Stairs to enter Entrance Stairs-Rails: Left Entrance Stairs-Number of Steps: 6 Home Layout: One level Home Equipment: None      Prior Function Level of Independence: Needs assistance   Gait / Transfers Assistance Needed: walked no device; +falls due to "sciatica"  ADL's / Homemaking Assistance Needed: independent with ADLs; assist IADLs  Hand Dominance        Extremity/Trunk Assessment   Upper Extremity Assessment: Overall WFL for tasks assessed           Lower Extremity Assessment: Generalized weakness      Cervical / Trunk Assessment: Normal  Communication   Communication:  Expressive difficulties (low volume and mumbles)  Cognition Arousal/Alertness: Awake/alert Behavior During Therapy: Anxious Overall Cognitive Status: Within Functional Limits for tasks assessed (fiance reports no observed changes)                      General Comments      Exercises        Assessment/Plan    PT Assessment Patient needs continued PT services  PT Diagnosis Difficulty walking;Generalized weakness   PT Problem List Decreased strength;Decreased activity tolerance;Decreased balance;Decreased mobility;Decreased knowledge of use of DME;Decreased safety awareness;Impaired sensation  PT Treatment Interventions DME instruction;Gait training;Stair training;Functional mobility training;Therapeutic activities;Patient/family education   PT Goals (Current goals can be found in the Care Plan section) Acute Rehab PT Goals Patient Stated Goal: get stronger; feet feel better PT Goal Formulation: With patient Time For Goal Achievement: 04/14/16 Potential to Achieve Goals: Good    Frequency Min 4X/week   Barriers to discharge        Co-evaluation               End of Session Equipment Utilized During Treatment: Gait belt Activity Tolerance: Patient limited by fatigue Patient left: in bed;with call bell/phone within reach;with family/visitor present Nurse Communication: Mobility status (recommend use of RW and with staff (not yet fiance))         Time: 1653-1710 PT Time Calculation (min) (ACUTE ONLY): 17 min   Charges:   PT Evaluation $PT Eval Low Complexity: 1 Procedure     PT G Codes:        Bama Hanselman 04-May-2016, 5:34 PM  Pager [phone]

## 2016-04-12 ENCOUNTER — Inpatient Hospital Stay (HOSPITAL_COMMUNITY): Payer: Self-pay

## 2016-04-12 DIAGNOSIS — F1010 Alcohol abuse, uncomplicated: Secondary | ICD-10-CM

## 2016-04-12 DIAGNOSIS — E876 Hypokalemia: Secondary | ICD-10-CM

## 2016-04-12 DIAGNOSIS — D696 Thrombocytopenia, unspecified: Secondary | ICD-10-CM

## 2016-04-12 DIAGNOSIS — F10231 Alcohol dependence with withdrawal delirium: Secondary | ICD-10-CM

## 2016-04-12 DIAGNOSIS — R569 Unspecified convulsions: Secondary | ICD-10-CM

## 2016-04-12 DIAGNOSIS — D649 Anemia, unspecified: Secondary | ICD-10-CM

## 2016-04-12 DIAGNOSIS — R740 Nonspecific elevation of levels of transaminase and lactic acid dehydrogenase [LDH]: Secondary | ICD-10-CM

## 2016-04-12 DIAGNOSIS — R7401 Elevation of levels of liver transaminase levels: Secondary | ICD-10-CM | POA: Diagnosis present

## 2016-04-12 DIAGNOSIS — R29898 Other symptoms and signs involving the musculoskeletal system: Secondary | ICD-10-CM

## 2016-04-12 LAB — IRON AND TIBC
Iron: 34 ug/dL — ABNORMAL LOW (ref 45–182)
SATURATION RATIOS: 15 % — AB (ref 17.9–39.5)
TIBC: 231 ug/dL — ABNORMAL LOW (ref 250–450)
UIBC: 197 ug/dL

## 2016-04-12 LAB — BASIC METABOLIC PANEL
Anion gap: 7 (ref 5–15)
BUN: 6 mg/dL (ref 6–20)
CO2: 26 mmol/L (ref 22–32)
Calcium: 9.1 mg/dL (ref 8.9–10.3)
Chloride: 103 mmol/L (ref 101–111)
Creatinine, Ser: 0.54 mg/dL — ABNORMAL LOW (ref 0.61–1.24)
GFR calc Af Amer: >60 mL/min (ref >60)
GFR calc non Af Amer: >60 mL/min (ref >60)
GLUCOSE: 109 mg/dL — AB (ref 65–99)
Potassium: 4.1 mmol/L (ref 3.5–5.1)
Sodium: 136 mmol/L (ref 135–145)

## 2016-04-12 LAB — FOLATE: FOLATE: 11.8 ng/mL (ref >5.9)

## 2016-04-12 LAB — VITAMIN B12: VITAMIN B 12: 702 pg/mL (ref 180–914)

## 2016-04-12 LAB — FERRITIN: FERRITIN: 482 ng/mL — AB (ref 24–336)

## 2016-04-12 LAB — PHOSPHORUS: Phosphorus: 2.7 mg/dL (ref 2.5–4.6)

## 2016-04-12 LAB — MAGNESIUM: MAGNESIUM: 1.7 mg/dL (ref 1.7–2.4)

## 2016-04-12 MED ORDER — MAGNESIUM SULFATE 4 GM/100ML IV SOLN
4.0000 g | Freq: Once | INTRAVENOUS | Status: AC
  Administered 2016-04-12: 4 g via INTRAVENOUS
  Filled 2016-04-12: qty 100

## 2016-04-12 MED ORDER — HYDRALAZINE HCL 20 MG/ML IJ SOLN
10.0000 mg | Freq: Once | INTRAMUSCULAR | Status: AC
  Administered 2016-04-12: 10 mg via INTRAVENOUS
  Filled 2016-04-12: qty 1

## 2016-04-12 NOTE — Progress Notes (Signed)
PROGRESS NOTE    Cory Reese  [id number] DOB: 22-Sep-1976 DOA: 04/09/2016 PCP: Concepcion LivingBERNHARDT, LINDA, NP    Brief Narrative:  40 y.o. male with history of daily EtOH abuse who took his last drink on the afternoon of 6/10 and was witnessed to have a generalized tonic clonic seizure 6/11 evening. He had never had a seizure before, despite the fact that he has quit drinking for days at a time in the past  Assessment & Plan:   Principal Problem:   Delirium tremens (HCC) Active Problems:   Leg weakness, bilateral   Seizures (HCC)   Hypokalemia   Hypomagnesemia   Hypophosphatemia   Alcohol abuse   Transaminitis   Anemia   Thrombocytopenia (HCC)   #1 delirium tremens Improvement. Continue the Ativan withdrawal protocol. Continue thiamine and folic acid. Follow.  #2 seizures Secondary to our call withdrawal. CT head negative. Patient with no further seizures. No indication for AEDs. Continue the Ativan withdrawal protocol. Continue thiamine, folic acid, multivitamin.  #3 bilateral lower extremity weakness Questionable etiology. CT head was negative. B-12 levels at 702. Folate levels at 11.8. Will check a vitamin B-1 and B-6 levels, RPR, HIV. Will check MRI of the T and L-spine to rule out transverse myelitis or other etiology. PT/OT.  #4 hypokalemia/hypomagnesemia/hypophosphatemia Secondary to alcohol abuse. Replete.  #5 transaminitis Likely secondary to ETOH abuse. Acute hepatitis panel negative. Follow.  #6 anemia/thrombocytopenia Likely alcohol induced. Follow.  #7 ETOH Abuse ETOH cessation. Ativan which are protocol. Social work following.  DVT prophylaxis: Lovenox Code Status: Full Family Communication: Updated patient and girlfriend at bedside. Disposition Plan: Home when medically stable, Ativan withdrawal protocol completed, and workup completed hopefully 1-2 days.   Consultants:   None  Procedures:   CT head 04/09/2016  Antimicrobials:    None   Subjective: Patient denies any chest in. No shortness of breath. No further seizures. Tremors improving. Patient complaining of significant lower extremity weakness.  Objective: Filed Vitals:   04/12/16 0018 04/12/16 0549 04/12/16 1408 04/12/16 1425  BP: 162/112 129/99  149/109  Pulse: 83 85  87  Temp: 98.6 F (37 C) 98.2 F (36.8 C)  99.5 F (37.5 C)  TempSrc: Oral Oral  Oral  Resp: 19 20    Height:      Weight:   75.297 kg (166 lb)   SpO2: 99% 99%  99%    Intake/Output Summary (Last 24 hours) at 04/12/16 1903 Last data filed at 04/12/16 1323  Gross per 24 hour  Intake    720 ml  Output    200 ml  Net    520 ml   Filed Weights   04/09/16 2135 04/10/16 1903 04/12/16 1408  Weight: 67.132 kg (148 lb) 64.411 kg (142 lb) 75.297 kg (166 lb)    Examination:  General exam: Appears calm and comfortable  Respiratory system: Clear to auscultation. Respiratory effort normal. Cardiovascular system: S1 & S2 heard, RRR. No JVD, murmurs, rubs, gallops or clicks. No pedal edema. Gastrointestinal system: Abdomen is nondistended, soft and nontender. No organomegaly or masses felt. Normal bowel sounds heard. Central nervous system: Alert and oriented. No focal neurological deficits. Extremities: 3-4/5 BLE strength. Skin: No rashes, lesions or ulcers Psychiatry: Judgement and insight appear normal. Mood & affect appropriate.     Data Reviewed: I have personally reviewed following labs and imaging studies  CBC:  Recent Labs Lab 04/09/16 2143 04/10/16 0307 04/11/16 0317  WBC 3.9* 5.8 4.6  NEUTROABS 2.9  --   --  HGB 12.5* 12.0* 12.3*  HCT 37.6* 36.0* 37.2*  MCV 96.9 95.0 95.6  PLT 114* 110* 107*   Basic Metabolic Panel:  Recent Labs Lab 04/09/16 2143 04/10/16 0307 04/11/16 0317 04/12/16 0613  NA 138 137 137 136  K 3.7 2.8* 3.3* 4.1  CL 98* 99* 100* 103  CO2 18* GLUCOSE 107* 80 91 109*  BUN 5* <5* 5* 6  CREATININE 0.86 0.74 0.72 0.54*   CALCIUM 8.7* 8.9 8.8* 9.1  MG 1.3*  --  2.1 1.7  PHOS 2.7  --  2.1* 2.7   GFR: Estimated Creatinine Clearance: 130.7 mL/min (by C-G formula based on Cr of 0.54). Liver Function Tests:  Recent Labs Lab 04/09/16 2143 04/10/16 0307 04/11/16 0317  AST 145* 160* 110*  ALT 74* 76* 66*  ALKPHOS 90 88 86  BILITOT 1.3* 1.8* 1.2  PROT 6.7 6.3* 6.1*  ALBUMIN 3.6 3.5 3.1*   No results for input(s): LIPASE, AMYLASE in the last 168 hours. No results for input(s): AMMONIA in the last 168 hours. Coagulation Profile: No results for input(s): INR, PROTIME in the last 168 hours. Cardiac Enzymes: No results for input(s): CKTOTAL, CKMB, CKMBINDEX, TROPONINI in the last 168 hours. BNP (last 3 results) No results for input(s): PROBNP in the last 8760 hours. HbA1C: No results for input(s): HGBA1C in the last 72 hours. CBG:  Recent Labs Lab 04/09/16 2136  GLUCAP 99   Lipid Profile: No results for input(s): CHOL, HDL, LDLCALC, TRIG, CHOLHDL, LDLDIRECT in the last 72 hours. Thyroid Function Tests: No results for input(s): TSH, T4TOTAL, FREET4, T3FREE, THYROIDAB in the last 72 hours. Anemia Panel:  Recent Labs  04/12/16 0613  VITAMINB12 702  FOLATE 11.8   Sepsis Labs: No results for input(s): PROCALCITON, LATICACIDVEN in the last 168 hours.  Recent Results (from the past 240 hour(s))  MRSA PCR Screening     Status: None   Collection Time: 04/10/16 10:37 PM  Result Value Ref Range Status   MRSA by PCR NEGATIVE NEGATIVE Final    Comment:        The GeneXpert MRSA Assay (FDA approved for NASAL specimens only), is one component of a comprehensive MRSA colonization surveillance program. It is not intended to diagnose MRSA infection nor to guide or monitor treatment for MRSA infections.          Radiology Studies: No results found.      Scheduled Meds: . cloNIDine  0.1 mg Oral BID  . enoxaparin (LOVENOX) injection  40 mg Subcutaneous Daily  . folic acid  1 mg Oral  Daily  . LORazepam  0-4 mg Oral Q12H  . multivitamin with minerals  1 tablet Oral Daily  . phosphorus  250 mg Oral BID  . thiamine  100 mg Oral Daily   Continuous Infusions:    LOS: 3 days    Time spent: 35 minutes    Kirsten Spearing, MD [facility] Pager [phone]  If 7PM-7AM, please contact night-coverage [email or web address] Password [facility] 04/12/2016, 7:03 PM

## 2016-04-12 NOTE — Progress Notes (Signed)
Occupational Therapy Evaluation Patient Details Name: Cory MoorsDarrell N Riles MRN: [id number] DOB: 02-21-1976 Today's Date: 04/12/2016    History of Present Illness 40 y.o. male with medical history significant for daily EtOH abuse who would like to quit, took last drink on afternoon of 6/10 and had witnessed generalized tonic clonic seizure 6/11  Significant PMHx-"sciatica" per pt report since accident 2015, PTSD, depression   Clinical Impression   PTA, pt independent with ADL and mobility but states he had falls due to his "sciatica". Will follow acutely to maximize independence with ADL and mobility to facilitate safe D/C home with initial 24/7 S.     Follow Up Recommendations  No OT follow up;Supervision/Assistance - 24 hour (initially)    Equipment Recommendations  Tub/shower seat    Recommendations for Other Services       Precautions / Restrictions Precautions Precautions: Fall Precaution Comments: reports has fallen several times in recent past      Mobility Bed Mobility Overal bed mobility: Modified Independent               Transfers Overall transfer level: Needs assistance Equipment used: 1 person hand held assist   Sit to Stand: Min guard         General transfer comment: Pt trembling - pt reported to OT that this "is all new"    Balance     Sitting balance-Leahy Scale: Good Sitting balance - Comments: reaching to his feet and outside BOS     Standing balance-Leahy Scale: fair                              ADL Overall ADL's : Needs assistance/impaired     Grooming: Supervision/safety;Standing   Upper Body Bathing: Set up;Sitting   Lower Body Bathing: Supervison/ safety;Set up;Sit to/from stand   Upper Body Dressing : Set up;Sitting   Lower Body Dressing: Supervision/safety;Set up;Sit to/from stand   Toilet Transfer: Min guard;Ambulation;Comfort height toilet           Functional mobility during ADLs: Min guard;Rolling  walker;Cueing for safety General ADL Comments: Educated on need to refrain from standing without RW in shower until his balance and standing tolerance improves. At this time, recommend pt use a shower chair. Pt states he has a plastic yard chair that he can use. Discussed importance of home set up and itme transport using RW. Recommended tying bag onto RW. Pt staes he has 6 children  who can help out. Fiance plans to return to work in a week. Pt given handout/educated on reducing risk of falls at home     Vision     Perception     Praxis      Pertinent Vitals/Pain Pain Assessment: No/denies pain     Hand Dominance     Extremity/Trunk Assessment Upper Extremity Assessment Upper Extremity Assessment: Generalized weakness   Lower Extremity Assessment Lower Extremity Assessment: Defer to PT evaluation   Cervical / Trunk Assessment Cervical / Trunk Assessment: Normal   Communication Communication Communication: No difficulties (low volume and mumbles)   Cognition Arousal/Alertness: Awake/alert Behavior During Therapy: Flat affect Overall Cognitive Status: Within Functional Limits for tasks assessed (fiance reports no observed changes)                     General Comments       Exercises       Shoulder Instructions      Home Living Family/patient  expects to be discharged to:: Private residence Living Arrangements: Spouse/significant other (fiance) Available Help at Discharge: Family;Available 24 hours/day Type of Home: House Home Access: Stairs to enter [employer] of Steps: 6 Entrance Stairs-Rails: Left Home Layout: One level     Bathroom Shower/Tub: Producer, television/film/video: Standard Bathroom Accessibility: Yes How Accessible: Accessible via walker Home Equipment: None          Prior Functioning/Environment Level of Independence: Needs assistance  Gait / Transfers Assistance Needed: walked no device; +falls due to  "sciatica" ADL's / Homemaking Assistance Needed: independent with ADLs; assist IADLs        OT Diagnosis: Generalized weakness   OT Problem List: Decreased strength;Decreased range of motion;Decreased activity tolerance;Impaired balance (sitting and/or standing);Decreased safety awareness;Decreased knowledge of use of DME or AE   OT Treatment/Interventions: Self-care/ADL training;Therapeutic exercise;Energy conservation;DME and/or AE instruction;Therapeutic activities;Patient/family education;Balance training    OT Goals(Current goals can be found in the care plan section) Acute Rehab OT Goals Patient Stated Goal: get stronger; feet feel better OT Goal Formulation: With patient Time For Goal Achievement: 04/19/16 Potential to Achieve Goals: Good ADL Goals Pt Will Perform Tub/Shower Transfer: Shower transfer;shower seat;rolling walker;with caregiver independent in assisting;with supervision Pt/caregiver will Perform Home Exercise Program: Both right and left upper extremity;With theraband;With Supervision;Increased strength (level 2) Additional ADL Goal #1: Pt will verbalize 3 strategies to reduce risk of falls at home  OT Frequency: Min 2X/week   Barriers to D/C:            Co-evaluation              End of Session Equipment Utilized During Treatment: Gait belt;Rolling walker Nurse Communication: Mobility status  Activity Tolerance: Patient tolerated treatment well Patient left: in bed;with call bell/phone within reach;with family/visitor present   Time: 1400-1415 OT Time Calculation (min): 15 min Charges:  OT General Charges $OT Visit: 1 Procedure OT Evaluation $OT Eval Moderate Complexity: 1 Procedure G-Codes:    Deforest Maiden,HILLARY Apr 20, 2016, 2:32 PM   [facility], OTR/L  [phone] April 20, 2016

## 2016-04-12 NOTE — Progress Notes (Signed)
Nutrition Brief Note  RD drawn to chart due to underweight status.   Wt Readings from Last 15 Encounters:  04/12/16 166 lb (75.297 kg)  04/07/16 163 lb (73.936 kg)  12/10/15 171 lb (77.565 kg)  08/19/15 202 lb (91.627 kg)  07/14/15 205 lb (92.987 kg)  07/02/15 205 lb (92.987 kg)  05/13/15 210 lb (95.255 kg)  05/12/15 210 lb (95.255 kg)  04/09/15 216 lb 7.9 oz (98.2 kg)  10/16/13 170 lb (77.111 kg)  10/11/13 170 lb (77.111 kg)   Cory MoorsDarrell N Reese is a 40 y.o. male with medical history significant for daily EtOH abuse who would like to quit, took last drink on afternoon of 6/10 and had witnessed generalized tonic clonic seizure this evening.   Pt admitted with seizures and DTs. Noted admission wt of 142#. Weight was verified on bedscale at 166#.   Spoke with pt at bedside. He reports good appetite both currently and PTA. He consumes 3-4 meals per day PTA. Pt reports he recently stopped eating red meat and pork in attempt to maintain a healthier weight. Pt shares that he has experienced intentional weight loss over the past 6 months to 1 year, due to consuming a more healthy diet. Pt acknowledges that he uses to weight around 200# and his clothes did not fit him. He reports UBW of 165-170#. Pt shares that she has been tall and slender in stature in entire life.   Nutrition-Focused physical exam completed. Findings are no fat depletion, mild (clavicel only) muscle depletion, and no edema.   Body mass index is 20.75 kg/(m^2). Patient meets criteria for normal weight range based on current BMI.   Current diet order is ,regular patient is consuming approximately 75-100% of meals at this time. Labs and medications reviewed.   No nutrition interventions warranted at this time. If nutrition issues arise, please consult RD.   Nishtha Raider A. Mayford KnifeWilliams, RD, LDN, CDE Pager: [phone] After hours Pager: [phone]

## 2016-04-12 NOTE — Progress Notes (Signed)
Physical Therapy Treatment Patient Details Name: Cory Reese MRN: [id number] DOB: 06-27-1976 Today's Date: 04/12/2016    History of Present Illness 40 y.o. male with medical history significant for daily EtOH abuse who would like to quit, took last drink on afternoon of 6/10 and had witnessed generalized tonic clonic seizure 6/11  Significant PMHx-"sciatica" per pt report since accident 2015, PTSD, depression    PT Comments    Pt needs RW for gait and will need RW for home.  Initiated stair training and needed min/guard A.  Educated on need to have someone with him on stairs.  Con't to recommend outpatient therapy if he has coverage.  Follow Up Recommendations  Outpatient PT;Supervision for mobility/OOB     Equipment Recommendations  Rolling walker with 5" wheels    Recommendations for Other Services OT consult     Precautions / Restrictions Precautions Precautions: Fall Precaution Comments: reports has fallen several times in recent past    Mobility  Bed Mobility               General bed mobility comments: sitting EOB upn arrival  Transfers Overall transfer level: Needs assistance Equipment used: Right platform walker Transfers: Sit to/from Stand Sit to Stand: Min guard         General transfer comment: cues for hand placement  Ambulation/Gait Ambulation/Gait assistance: Min guard Ambulation Distance (Feet): 250 Feet Assistive device: Rolling walker (2 wheeled) Gait Pattern/deviations: Step-through pattern;Trunk flexed;Narrow base of support Gait velocity: decreased Gait velocity interpretation: Below normal speed for age/gender General Gait Details: Heavy reliance on RW with decreased reliance during second half, but still needing support.  Legs trembling and needing cues for posture.   Stairs Stairs: Yes Stairs assistance: Min guard Stair Management: One rail Left;Sideways;Step to pattern Number of Stairs: 6 General stair comments: Heavy use of  rail to A with pulling self up stairs. no physical A needed from PT, but cues for step to pattern for safety.  Fiance did not obesrve, but educated on how to A him.  Wheelchair Mobility    Modified Rankin (Stroke Patients Only)       Balance     Sitting balance-Leahy Scale: Good       Standing balance-Leahy Scale: Poor                      Cognition Arousal/Alertness: Awake/alert Behavior During Therapy: Flat affect Overall Cognitive Status: Within Functional Limits for tasks assessed                      Exercises      General Comments General comments (skin integrity, edema, etc.): Has been getting up in room with fiance A and use of RW.  Educated on standing ther ex and how to progress from RW to cane if he does not qualify for outpatient PT.  All questions answered.       Pertinent Vitals/Pain Pain Assessment: No/denies pain    Home Living                      Prior Function            PT Goals (current goals can now be found in the care plan section) Acute Rehab PT Goals Patient Stated Goal: get stronger; feet feel better PT Goal Formulation: With patient Time For Goal Achievement: 04/14/16 Potential to Achieve Goals: Good Progress towards PT goals: Goals met and updated - see care  plan    Frequency  Min 4X/week    PT Plan Current plan remains appropriate    Co-evaluation             End of Session Equipment Utilized During Treatment: Gait belt Activity Tolerance: Patient limited by fatigue;Patient tolerated treatment well Patient left: in bed;with call bell/phone within reach;with family/visitor present (sitting EOB)     Time: 8472-0721 PT Time Calculation (min) (ACUTE ONLY): 22 min  Charges:  $Gait Training: 8-22 mins                    G Codes:      Cory Reese 04/12/2016, 9:44 AM

## 2016-04-12 NOTE — Care Management Note (Signed)
Case Management Note  Patient Details  Name: Cory Reese MRN: [id number] Date of Birth: 1976-02-23  Subjective/Objective:                 Spoke with patient in the room. He states that [facility] is uninsured [facility] he [facility] med[facility]. Patient states that he cannot afford copay at this time for outpatient or home health PT. [facility] Patient denies ETOH abuse. He lives at home with his fiance and she drives to appointments. CM attempting to get RW for patient to charity. Patient transferred from [place] yesterday, admitted with DT. Now replacing electrolytes IV.     Action/Plan:  CM will continue to follow for DC planning.   Expected Discharge Date:  04/13/16               Expected Discharge Plan:  Home/Self Care  In-House Referral:     Discharge planning Services  CM Consult, Indigent Health Clinic, Medication Assistance  Post Acute Care Choice:  NA Choice offered to:     DME Arranged:    DME Agency:     HH Arranged:    HH Agency:     Status of Service:  In process, will continue to follow  Medicare Important Message Given:    Date Medicare IM Given:    Medicare IM give by:    Date Additional Medicare IM Given:    Additional Medicare Important Message give by:     If discussed at Long Length of Stay Meetings, dates discussed:    Additional Comments:  Lawerance SabalDebbie Kinnick Maus, RN 04/12/2016, 12:19 PM

## 2016-04-13 ENCOUNTER — Inpatient Hospital Stay (HOSPITAL_COMMUNITY): Payer: MEDICAID

## 2016-04-13 DIAGNOSIS — R569 Unspecified convulsions: Secondary | ICD-10-CM | POA: Insufficient documentation

## 2016-04-13 LAB — CBC
HCT: 36.1 % — ABNORMAL LOW (ref 39.0–52.0)
Hemoglobin: 11.9 g/dL — ABNORMAL LOW (ref 13.0–17.0)
MCH: 32.1 pg (ref 26.0–34.0)
MCHC: 33 g/dL (ref 30.0–36.0)
MCV: 97.3 fL (ref 78.0–100.0)
PLATELETS: 135 10*3/uL — AB (ref 150–400)
RBC: 3.71 MIL/uL — ABNORMAL LOW (ref 4.22–5.81)
RDW: 11.7 % (ref 11.5–15.5)
WBC: 5.7 10*3/uL (ref 4.0–10.5)

## 2016-04-13 LAB — COMPREHENSIVE METABOLIC PANEL
ALBUMIN: 3 g/dL — AB (ref 3.5–5.0)
ALK PHOS: 81 U/L (ref 38–126)
ALT: 61 U/L (ref 17–63)
AST: 85 U/L — AB (ref 15–41)
Anion gap: 9 (ref 5–15)
BILIRUBIN TOTAL: 0.8 mg/dL (ref 0.3–1.2)
BUN: 5 mg/dL — AB (ref 6–20)
CALCIUM: 9.1 mg/dL (ref 8.9–10.3)
CO2: 25 mmol/L (ref 22–32)
CREATININE: 0.58 mg/dL — AB (ref 0.61–1.24)
Chloride: 103 mmol/L (ref 101–111)
GFR calc Af Amer: >60 mL/min (ref >60)
GLUCOSE: 101 mg/dL — AB (ref 65–99)
Potassium: 3.7 mmol/L (ref 3.5–5.1)
Sodium: 137 mmol/L (ref 135–145)
TOTAL PROTEIN: 5.9 g/dL — AB (ref 6.5–8.1)

## 2016-04-13 LAB — PHOSPHORUS: PHOSPHORUS: 3.8 mg/dL (ref 2.5–4.6)

## 2016-04-13 LAB — HIV ANTIBODY (ROUTINE TESTING W REFLEX): HIV Screen 4th Generation wRfx: NONREACTIVE

## 2016-04-13 LAB — RPR: RPR Ser Ql: NONREACTIVE

## 2016-04-13 LAB — MAGNESIUM: MAGNESIUM: 1.8 mg/dL (ref 1.7–2.4)

## 2016-04-13 MED ORDER — HYDROCODONE-ACETAMINOPHEN 5-325 MG PO TABS: 1.0000 | ORAL_TABLET | Freq: Four times a day (QID) | ORAL | Status: AC | PRN

## 2016-04-13 MED ORDER — CLONIDINE HCL 0.1 MG PO TABS: 0.1000 mg | ORAL_TABLET | Freq: Two times a day (BID) | ORAL | Status: DC

## 2016-04-13 MED ORDER — IBUPROFEN 400 MG PO TABS: 400.0000 mg | ORAL_TABLET | Freq: Three times a day (TID) | ORAL | Status: DC | PRN

## 2016-04-13 MED ORDER — LORAZEPAM 2 MG/ML IJ SOLN
1.0000 mg | Freq: Once | INTRAMUSCULAR | Status: AC
  Administered 2016-04-13: 1 mg via INTRAVENOUS
  Filled 2016-04-13: qty 1

## 2016-04-13 MED ORDER — GADOBENATE DIMEGLUMINE 529 MG/ML IV SOLN
15.0000 mL | Freq: Once | INTRAVENOUS | Status: AC
  Administered 2016-04-13: 15 mL via INTRAVENOUS

## 2016-04-13 MED ORDER — PANTOPRAZOLE SODIUM 40 MG PO TBEC: 40.0000 mg | DELAYED_RELEASE_TABLET | Freq: Every day | ORAL | Status: DC

## 2016-04-13 MED ORDER — FOLIC ACID 1 MG PO TABS: 1.0000 mg | ORAL_TABLET | Freq: Every day | ORAL | Status: DC

## 2016-04-13 MED ORDER — THIAMINE HCL 100 MG PO TABS: 100.0000 mg | ORAL_TABLET | Freq: Every day | ORAL | Status: DC

## 2016-04-13 MED ORDER — SENNOSIDES-DOCUSATE SODIUM 8.6-50 MG PO TABS: 1.0000 | ORAL_TABLET | Freq: Every evening | ORAL | Status: DC | PRN

## 2016-04-13 NOTE — Progress Notes (Signed)
Occupational Therapy Treatment Patient Details Name: Cory MoorsDarrell N Reese MRN: [id number] DOB: 1976-04-08 Today's Date: 04/13/2016    History of present illness 40 y.o. male with medical history significant for daily EtOH abuse who would like to quit, took last drink on afternoon of 6/10 and had witnessed generalized tonic clonic seizure 6/11  Significant PMHx-"sciatica" per pt report since accident 2015, PTSD, depression   OT comments  Completed education regarding AE and compensatory techniques for ADL and functional mobility. Ambulated @ 150 ft with RW. Issued AE to assist with ADL. Pt needs tall RW and would benefit from 3 in 1 for safe D/C home.   Follow Up Recommendations  No OT follow up;Supervision/Assistance - 24 hour    Equipment Recommendations  3 in 1   Recommendations for Other Services      Precautions / Restrictions Precautions Precautions: Fall Precaution Comments: reports L leg "gives out on him"       Mobility Bed Mobility               General bed mobility comments: OOB in chair  Transfers Overall transfer level: Needs assistance Equipment used: Rolling walker (2 wheeled) Transfers: Sit to/from [employer]Stand;Stand Pivot Transfers Sit to Stand: Supervision Stand pivot transfers: Supervision            Balance     Sitting balance-Leahy Scale: Good       Standing balance-Leahy Scale: Fair                     ADL                                         General ADL Comments: comleted education regarding safe transfer technique into walk in shower. Again recommended pt use shower chair. Wife states they will use a Nurse, adultplastic lawn chair. with arms. Pt issued a long handled sponge to assist with bathing and a reacher to assist with ADL and IADL tasks. Pt reports L leg "giving away", but did not observe this during the 150 ft of ambulationi. Reviewed strategies to reduce risk of falls. Pt verbalized understanding. Wife present for  session.       Vision                     Perception     Praxis      Cognition   Behavior During Therapy: Flat affect Overall Cognitive Status: Within Functional Limits for tasks assessed                       Extremity/Trunk Assessment               Exercises Other Exercises Other Exercises: issued theraband for general strengthening ex for BUE and LE to increase stregnth for ADL   Shoulder Instructions       General Comments      Pertinent Vitals/ Pain       Pain Assessment: Faces Pain Score: 4  Pain Location: back Pain Descriptors / Indicators: Aching;Discomfort Pain Intervention(s): Limited activity within patient's tolerance  Home Living                                          Prior Functioning/Environment  Frequency Min 2X/week     Progress Toward Goals  OT Goals(current goals can now be found in the care plan section)  Progress towards OT goals: Progressing toward goals  Acute Rehab OT Goals Patient Stated Goal: get stronger; feet feel better OT Goal Formulation: With patient Time For Goal Achievement: 04/19/16 Potential to Achieve Goals: Good ADL Goals Pt Will Perform Tub/Shower Transfer: Shower transfer;shower seat;rolling walker;with caregiver independent in assisting;with supervision Pt/caregiver will Perform Home Exercise Program: Both right and left upper extremity;With theraband;With Supervision;Increased strength Additional ADL Goal #1: Pt will verbalize 3 strategies to reduce risk of falls at home  Plan Discharge plan remains appropriate    Co-evaluation                 End of Session Equipment Utilized During Treatment: Gait belt;Rolling walker   Activity Tolerance Patient tolerated treatment well   Patient Left in chair;with call bell/phone within reach;with family/visitor present   Nurse Communication Mobility status        Time: 6578-4696 OT Time Calculation  (min): 25 min  Charges: OT General Charges $OT Visit: 1 Procedure OT Treatments $Self Care/Home Management : 23-37 mins  Lelar Farewell,HILLARY 04/13/2016, 10:31 AM   Luisa Dago, OTR/L  [phone] 04/13/2016

## 2016-04-13 NOTE — Progress Notes (Signed)
Pt given discharge instructions, prescriptions, and care notes. Pt verbalized understanding AEB no further questions or concerns at this time. IV was discontinued, no redness, pain, or swelling noted at this time. Telemetry discontinued and Centralized Telemetry was notified. Pt left the floor via wheelchair with staff in stable condition. 

## 2016-04-13 NOTE — Progress Notes (Signed)
Physical Therapy Treatment Patient Details Name: Herbert MoorsDarrell N Line MRN: [id number] DOB: 1976-08-23 Today's Date: 04/13/2016    History of Present Illness 40 y.o. male with medical history significant for daily EtOH abuse who would like to quit, took last drink on afternoon of 6/10 and had witnessed generalized tonic clonic seizure 6/11  Significant PMHx-"sciatica" per pt report since accident 2015, PTSD, depression    PT Comments    Pt performed increased gait and stair training with decreased assistance.  Pt performed with no signs of buckling.  Educated pt to ambulate with wife.  Remains to shake during treatment, most likley due to ETOH withdrawal.     Follow Up Recommendations  Outpatient PT;Supervision for mobility/OOB     Equipment Recommendations  Rolling walker with 5" wheels    Recommendations for Other Services OT consult     Precautions / Restrictions Precautions Precautions: Fall Precaution Comments: reports L leg "gives out on him" .  (This was not observed during intervention today.  )    Mobility  Bed Mobility Overal bed mobility: Modified Independent             General bed mobility comments: Pt performed bed mobility with good technique.    Transfers Overall transfer level: Needs assistance Equipment used: Rolling walker (2 wheeled) Transfers: Sit to/from Stand Sit to Stand: Supervision Stand pivot transfers: Supervision       General transfer comment: Pt slow to ascend but no physical assistance required.  Good technique observed.    Ambulation/Gait Ambulation/Gait assistance: Supervision Ambulation Distance (Feet): 320 Feet Assistive device: Rolling walker (2 wheeled) Gait Pattern/deviations: Step-through pattern;Trunk flexed;Narrow base of support Gait velocity: decreased Gait velocity interpretation: Below normal speed for age/gender General Gait Details: Heavy reliance on RW with decreased reliance during second half, but still needing  support.  Required cues for posture, no L knee buckling noted.     Stairs Stairs: Yes Stairs assistance: Supervision Stair Management: One rail Left;Forwards;Sideways Number of Stairs: 6 General stair comments: Pt performed with decreased assistance.  Pt performed forward to ascend and sideways to descend with heavy reliance of L rail.    Wheelchair Mobility    Modified Rankin (Stroke Patients Only)       Balance Overall balance assessment: Needs assistance;History of Falls Sitting-balance support: No upper extremity supported;Feet supported Sitting balance-Leahy Scale: Good Sitting balance - Comments: reaching to his feet and outside BOS     Standing balance-Leahy Scale: Fair                      Cognition Arousal/Alertness: Awake/alert Behavior During Therapy: Flat affect Overall Cognitive Status: Within Functional Limits for tasks assessed                      Exercises Other Exercises Other Exercises: issued theraband for general strengthening ex for BUE and LE to increase stregnth for ADL    General Comments        Pertinent Vitals/Pain Pain Assessment: 0-10 Pain Score: 4  Pain Location: Back and L leg.   Pain Descriptors / Indicators: Aching;Discomfort Pain Intervention(s): Limited activity within patient's tolerance;Repositioned    Home Living                      Prior Function            PT Goals (current goals can now be found in the care plan section) Acute Rehab PT Goals Patient Stated  Goal: get stronger; feet feel better Potential to Achieve Goals: Good Progress towards PT goals: Progressing toward goals    Frequency  Min 4X/week    PT Plan Current plan remains appropriate    Co-evaluation             End of Session Equipment Utilized During Treatment: Gait belt Activity Tolerance: Patient limited by fatigue;Patient tolerated treatment well Patient left: in bed;with call bell/phone within reach;with  family/visitor present (sitting edge of bed.  )     Time: 1610-9604 PT Time Calculation (min) (ACUTE ONLY): 14 min  Charges:  $Gait Training: 23-37 mins                    G Codes:      Florestine Avers 05-11-2016, 12:03 PM Joycelyn Rua, PTA pager [phone]

## 2016-04-13 NOTE — Discharge Summary (Signed)
Physician Discharge Summary  Cory Reese ZOX:[id number] DOB: [birth year] DOA: 04/09/2016  PCP: Concepcion LivingBERNHARDT, LINDA, NP  Admit date: 04/09/2016 Discharge date: 04/13/2016  Time spent: 65 minutes  Recommendations for Outpatient Follow-up:  1. Follow-up with PCP one week. On follow-up Vitamin B-1 and B-6 levels which were obtained during the hospitalization need to be followed up upon. Patient sciatica also needs to be followed up upon. Patient will need a comprehensive metabolic profile to follow-up on electrolytes and renal function as well as LFTs. Patient also need a CBC done to follow-up on his H&H.   Discharge Diagnoses:  Principal Problem:   Delirium tremens (HCC) Active Problems:   Leg weakness, bilateral   Seizures (HCC)   Hypokalemia   Hypomagnesemia   Hypophosphatemia   Alcohol abuse   Transaminitis   Anemia   Thrombocytopenia (HCC)   Bilateral leg weakness   Seizure (HCC)   Discharge Condition: Stable and improved  Diet recommendation: Regular  Filed Weights   04/09/16 2135 04/10/16 1903 04/12/16 1408  Weight: 67.132 kg (148 lb) 64.411 kg (142 lb) 75.297 kg (166 lb)    History of present illness:  Per Dr Rozanna BoerIkramullah Eliodoro N Martucci is a 40 y.o. male with medical history significant for daily EtOH abuse who would like to quit, took last drink on afternoon of 6/10 and had witnessed generalized tonic clonic seizure the evening of admission. Fiance witnessed. Denied recent chest pain, fever, nausea, vomiting. He had been somewhat confused and somnolent after the seizure. Never had a seizure before, has quit drinking for days at a time in the past but never had bad withdrawal symptoms.   ED Course: Was found in ED to have labs c/w alcohol abuse, CT head negative.   Hospital Course:  #1 delirium tremens Patient noted to have delirium tremens on initial presentation during the hospitalization. Patient was maintained on the Ativan withdrawal protocol as well as thiamine  and folic acid and a multivitamin. Patient was delirium tremens improved during the hospitalization and patient was discharged home in stable and improved condition.   #2 seizures Secondary to ETOH withdrawal. CT head negative. Patient was initially admitted to the step down unit placed on the Ativan withdrawal protocol and monitored with seizure precautions. Patient did not have any further seizures during the hospitalization. No indication for AEDs. Patient was maintained on thiamine, folic acid and multivitamin. Patient finished the Ativan withdrawal protocol. Patient be discharged home in stable and improved condition to follow-up with PCP as outpatient.  #3 bilateral lower extremity weakness Questionable etiology. Likely secondary to chronic alcohol use. CT head was negative. B-12 levels at 702. Folate levels at 11.8. RPR which was done was nonreactive. HIV was nonreactive. MRI of the T and L-spine was negative for any acute abnormalities. Patient was seen by physical therapy and followed during the hospitalization. Patient be discharged home with outpatient PT. Patient's vitamin B-1 and B-6 levels were pending at time of discharge and will need to be followed up upon.   #4 hypokalemia/hypomagnesemia/hypophosphatemia Secondary to alcohol abuse. Patient's electrolytes were repleted during the hospitalization. Outpatient follow-up.   #5 transaminitis Likely secondary to ETOH abuse. Acute hepatitis panel negative. Follow.  #6 anemia/thrombocytopenia Likely alcohol induced. Patient had no overt bleeding. Anemia panel which was done had an and level of 34 TIBC of 231 ferritin of 482. Folate was 11.8. Vitamin B 12 was 702. Patient is thrombocytopenia improved during the hospitalization. Outpatient follow-up.   #7 ETOH Abuse ETOH cessation. Patient was placed  on the Ativan withdrawal protocol. Social work saw patient during the hospitalization and patient was given information concerning alcohol  cessation. Patient was also maintained on primidone folic acid. Outpatient follow-up.   #8 Sciatica Patient had complaints of shooting pain from the buttocks region down to his lower extremity. MRI of the T and L-spine which was done was negative. Patient be discharged home on ibuprofen 800 mg every 8 hours as needed. Outpatient follow-up.   Procedures:  CT head 04/09/2016  MRI T and L-spine 04/12/2016, 04/13/2016  Consultations:  None  Discharge Exam: Filed Vitals:   04/13/16 0502 04/13/16 1154  BP: 146/98 139/100  Pulse: 83 93  Temp: 98.2 F (36.8 C) 98.1 F (36.7 C)  Resp: 18 18    General: NAD Cardiovascular: RRR Respiratory: CTAB  Discharge Instructions   Discharge Instructions    Diet general    Complete by:  As directed      Discharge instructions    Complete by:  As directed   Follow up with Concepcion Living, NP in 1 week.     Increase activity slowly    Complete by:  As directed           Current Discharge Medication List    START taking these medications   Details  cloNIDine (CATAPRES) 0.1 MG tablet Take 1 tablet (0.1 mg total) by mouth 2 (two) times daily. Qty: 60 tablet, Refills: 3    folic acid (FOLVITE) 1 MG tablet Take 1 tablet (1 mg total) by mouth daily.    ibuprofen (ADVIL,MOTRIN) 400 MG tablet Take 1-2 tablets (400-800 mg total) by mouth every 8 (eight) hours as needed for moderate pain. Qty: 20 tablet, Refills: 0    pantoprazole (PROTONIX) 40 MG tablet Take 1 tablet (40 mg total) by mouth daily. Qty: 30 tablet, Refills: 3    senna-docusate (SENOKOT-S) 8.6-50 MG tablet Take 1 tablet by mouth at bedtime as needed for mild constipation.    thiamine 100 MG tablet Take 1 tablet (100 mg total) by mouth daily.      CONTINUE these medications which have CHANGED   Details  HYDROcodone-acetaminophen (NORCO/VICODIN) 5-325 MG tablet Take 1 tablet by mouth every 6 (six) hours as needed for moderate pain. Qty: 10 tablet, Refills: 0       CONTINUE these medications which have NOT CHANGED   Details  clobetasol (TEMOVATE) 0.05 % external solution Apply 1 application topically 2 (two) times daily. Qty: 50 mL, Refills: 2    Multiple Vitamin (MULTIVITAMIN WITH MINERALS) TABS tablet Take 1 tablet by mouth daily.    meloxicam (MOBIC) 15 MG tablet Take 1 tablet (15 mg total) by mouth daily. Qty: 30 tablet, Refills: 1   Associated Diagnoses: Low back pain with right-sided sciatica, unspecified back pain laterality    triamcinolone (KENALOG) 0.025 % ointment Apply 1 application topically 2 (two) times daily. Qty: 80 g, Refills: 0      STOP taking these medications     naproxen sodium (ANAPROX) 220 MG tablet      clindamycin (CLEOCIN) 300 MG capsule      diphenhydrAMINE (BENADRYL) 25 MG tablet      doxycycline (VIBRAMYCIN) 100 MG capsule      gabapentin (NEURONTIN) 100 MG capsule      hydrocortisone 1 % ointment      hydrocortisone ointment 0.5 %      predniSONE (DELTASONE) 20 MG tablet      Skin Protectants, Misc. (EUCERIN) cream  sulfamethoxazole-trimethoprim (BACTRIM DS,SEPTRA DS) 800-160 MG tablet      traMADol (ULTRAM) 50 MG tablet      Triamcinolone Acetonide (TRIAMCINOLONE 0.1 % CREAM : EUCERIN) CREA        Allergies  Allergen Reactions  . Doxycycline Rash   Follow-up Information    Follow up with [facility]. Schedule an appointment as soon as possible for a visit in 1 week.   Contact information:   [address] [place] [address] [phone]       The results of significant diagnostics from this hospitalization (including imaging, microbiology, ancillary and laboratory) are listed below for reference.    Significant Diagnostic Studies: Ct Head Wo Contrast  04/09/2016  CLINICAL DATA:  Acute onset of seizure, with shaking and foaming at the mouth. Initial encounter. EXAM: CT HEAD WITHOUT CONTRAST TECHNIQUE: Contiguous axial images  were obtained from the base of the skull through the vertex without intravenous contrast. COMPARISON:  None. FINDINGS: There is no evidence of acute infarction, mass lesion, or intra- or extra-axial hemorrhage on CT. The posterior fossa, including the cerebellum, brainstem and fourth ventricle, is within normal limits. The third and lateral ventricles, and basal ganglia are unremarkable in appearance. The cerebral hemispheres are symmetric in appearance, with normal gray-white differentiation. No mass effect or midline shift is seen. There is no evidence of fracture; visualized osseous structures are unremarkable in appearance. The visualized portions of the orbits are within normal limits. The paranasal sinuses and mastoid air cells are well-aerated. No significant soft tissue abnormalities are seen. IMPRESSION: Unremarkable noncontrast CT of the head. Electronically Signed   By: Roanna Raider M.D.   On: 04/09/2016 22:55   Mr Thoracic Spine Wo Contrast  04/12/2016  CLINICAL DATA:  Initial evaluation for acute bilateral lower extremity weakness. EXAM: MRI THORACIC SPINE WITHOUT CONTRAST TECHNIQUE: Multiplanar, multisequence MR imaging of the thoracic spine was performed. No intravenous contrast was administered. COMPARISON:  Prior CT from 04/05/2015. FINDINGS: Alignment: Vertebral bodies are normally aligned with preservation of the normal thoracic kyphosis. No listhesis or malalignment. Vertebrae: Vertebral body height is well preserved. Signal intensity within the vertebral body bone marrow is normal. No acute or chronic fracture. No focal osseous lesions. No marrow edema. Cord:  Signal intensity within the thoracic spinal cord is normal. Paraspinal and other soft tissues: Paraspinous soft tissues demonstrate no acute abnormality. Visualized lungs are clear. Mild multilevel degenerative spondylolysis noted within the partially visualized upper cervical spine on counter sequence. No significant stenosis. Disc  levels: C7-T1:. Shallow broad-based posterior disc bulge flattens the ventral thecal sac without significant stenosis. T7-8: Mild left-sided posterior element hypertrophy without stenosis. T8-9: Mild left-sided posterior element hypertrophy without stenosis. T10-11: Mild disc desiccation without significant disc bulge or focal disc herniation. No stenosis. No other significant degenerative changes noted within the thoracic spine. IMPRESSION: 1. Essentially normal MRI of the thoracic spine. No abnormal cord signal identified. 2. Minimal multilevel degenerative changes as above without significant stenosis. 3. Shallow posterior disc protrusion at C7-T1 without significant stenosis. Electronically Signed   By: Rise Mu M.D.   On: 04/12/2016 23:04   Mr Lumbar Spine W Wo Contrast  04/13/2016  CLINICAL DATA:  Acute bilateral lower extremity weakness. EXAM: MRI LUMBAR SPINE WITHOUT AND WITH CONTRAST TECHNIQUE: Multiplanar and multiecho pulse sequences of the lumbar spine were obtained without and with intravenous contrast. CONTRAST:  15mL MULTIHANCE GADOBENATE DIMEGLUMINE 529 MG/ML IV SOLN COMPARISON:  None. FINDINGS: Motion degraded study  but overall diagnostic. Segmentation: Standard. Alignment: Negative for subluxation. Vertebrae: No signal abnormality to suggest fracture, discitis, or mass. Conus: Extends to the L1 level and appears normal. No nerve root thickening or enhancement. Paraspinal and retroperitoneal structures: Negative. Disc levels: No notable degeneration.  No herniation or stenosis. IMPRESSION: Normal exam.  No explanation for weakness. Electronically Signed   By: Marnee Spring M.D.   On: 04/13/2016 09:53    Microbiology: Recent Results (from the past 240 hour(s))  MRSA PCR Screening     Status: None   Collection Time: 04/10/16 10:37 PM  Result Value Ref Range Status   MRSA by PCR NEGATIVE NEGATIVE Final    Comment:        The GeneXpert MRSA Assay (FDA approved for NASAL  specimens only), is one component of a comprehensive MRSA colonization surveillance program. It is not intended to diagnose MRSA infection nor to guide or monitor treatment for MRSA infections.      Labs: Basic Metabolic Panel:  Recent Labs Lab 04/09/16 2143 04/10/16 0307 04/11/16 0317 04/12/16 0613 04/13/16 0550  NA 138 137 137 136 137  K 3.7 2.8* 3.3* 4.1 3.7  CL 98* 99* 100* 103 103  CO2 18* 27 28 26 25   GLUCOSE 107* 80 91 109* 101*  BUN 5* <5* 5* 6 5*  CREATININE 0.86 0.74 0.72 0.54* 0.58*  CALCIUM 8.7* 8.9 8.8* 9.1 9.1  MG 1.3*  --  2.1 1.7 1.8  PHOS 2.7  --  2.1* 2.7 3.8   Liver Function Tests:  Recent Labs Lab 04/09/16 2143 04/10/16 0307 04/11/16 0317 04/13/16 0550  AST 145* 160* 110* 85*  ALT 74* 76* 66* 61  ALKPHOS 90 88 86 81  BILITOT 1.3* 1.8* 1.2 0.8  PROT 6.7 6.3* 6.1* 5.9*  ALBUMIN 3.6 3.5 3.1* 3.0*   No results for input(s): LIPASE, AMYLASE in the last 168 hours. No results for input(s): AMMONIA in the last 168 hours. CBC:  Recent Labs Lab 04/09/16 2143 04/10/16 0307 04/11/16 0317 04/13/16 0550  WBC 3.9* 5.8 4.6 5.7  NEUTROABS 2.9  --   --   --   HGB 12.5* 12.0* 12.3* 11.9*  HCT 37.6* 36.0* 37.2* 36.1*  MCV 96.9 95.0 95.6 97.3  PLT 114* 110* 107* 135*   Cardiac Enzymes: No results for input(s): CKTOTAL, CKMB, CKMBINDEX, TROPONINI in the last 168 hours. BNP: BNP (last 3 results)  Recent Labs  07/02/15 1045  BNP 11.8    ProBNP (last 3 results) No results for input(s): PROBNP in the last 8760 hours.  CBG:  Recent Labs Lab 04/09/16 2136  GLUCAP 99       Signed:  Maegan Buller MD.  [facility] 04/13/2016, 4:32 PM

## 2016-04-13 NOTE — Progress Notes (Signed)
CSW met with pt to provide alcohol rehab options- pt reports that he no longer drinks and that he plans on going to [facility] for help with quitting alcohol.  Pt agreeable to receiving list of other options- CSW provided alternate outpatient rehab resources.  CSW signing off  Domenica Reamer, [place] Social Worker [phone]

## 2016-04-15 LAB — VITAMIN B6: Vitamin B6: 5.5 ug/L (ref 5.3–46.7)

## 2016-04-15 LAB — VITAMIN B1: Vitamin B1 (Thiamine): 117.8 nmol/L (ref 66.5–200.0)

## 2016-04-19 MED FILL — CLOBETASOL 0.05% SOLUTION: 0.05 | 25 days supply | Qty: 50 | Fill #1

## 2016-05-19 MED FILL — CLOBETASOL 0.05% SOLUTION: 0.05 | 25 days supply | Qty: 50 | Fill #2

## 2016-05-22 ENCOUNTER — Ambulatory Visit (INDEPENDENT_AMBULATORY_CARE_PROVIDER_SITE_OTHER): Payer: Self-pay | Admitting: Family Medicine

## 2016-05-22 ENCOUNTER — Other Ambulatory Visit: Payer: Self-pay | Admitting: Family Medicine

## 2016-05-22 ENCOUNTER — Encounter: Payer: Self-pay | Admitting: Family Medicine

## 2016-05-22 VITALS — BP 141/82 | HR 83 | Temp 98.2°F | Ht 75.0 in | Wt 164.0 lb

## 2016-05-22 DIAGNOSIS — F1010 Alcohol abuse, uncomplicated: Secondary | ICD-10-CM

## 2016-05-22 DIAGNOSIS — F1020 Alcohol dependence, uncomplicated: Secondary | ICD-10-CM

## 2016-05-22 DIAGNOSIS — R21 Rash and other nonspecific skin eruption: Secondary | ICD-10-CM

## 2016-05-22 LAB — COMPLETE METABOLIC PANEL WITH GFR
ALBUMIN: 3.6 g/dL (ref 3.6–5.1)
ALK PHOS: 56 U/L (ref 40–115)
ALT: 13 U/L (ref 9–46)
AST: 21 U/L (ref 10–40)
BILIRUBIN TOTAL: 0.7 mg/dL (ref 0.2–1.2)
BUN: 11 mg/dL (ref 7–25)
CO2: 24 mmol/L (ref 20–31)
Calcium: 9 mg/dL (ref 8.6–10.3)
Chloride: 105 mmol/L (ref 98–110)
Creat: 0.81 mg/dL (ref 0.60–1.35)
GFR, Est African American: >89 mL/min (ref >=60)
GFR, Est Non African American: >89 mL/min (ref >=60)
GLUCOSE: 93 mg/dL (ref 65–99)
Potassium: 4.1 mmol/L (ref 3.5–5.3)
SODIUM: 140 mmol/L (ref 135–146)
TOTAL PROTEIN: 6.3 g/dL (ref 6.1–8.1)

## 2016-05-22 LAB — CBC WITH DIFFERENTIAL/PLATELET
BASOS ABS: 0 {cells}/uL (ref 0–200)
Basophils Relative: 0 %
EOS PCT: 2 %
Eosinophils Absolute: 148 cells/uL (ref 15–500)
HCT: 37.5 % — ABNORMAL LOW (ref 38.5–50.0)
HEMOGLOBIN: 12.5 g/dL — AB (ref 13.2–17.1)
Lymphocytes Relative: 17 %
Lymphs Abs: 1258 cells/uL (ref 850–3900)
MCH: 31.9 pg (ref 27.0–33.0)
MCHC: 33.3 g/dL (ref 32.0–36.0)
MCV: 95.7 fL (ref 80.0–100.0)
MONOS PCT: 8 %
MPV: 10.5 fL (ref 7.5–12.5)
Monocytes Absolute: 592 cells/uL (ref 200–950)
NEUTROS PCT: 73 %
Neutro Abs: 5402 cells/uL (ref 1500–7800)
PLATELETS: 253 10*3/uL (ref 140–400)
RBC: 3.92 MIL/uL — ABNORMAL LOW (ref 4.20–5.80)
RDW: 12.8 % (ref 11.0–15.0)
WBC: 7.4 10*3/uL (ref 3.8–10.8)

## 2016-05-22 NOTE — Progress Notes (Signed)
Cory Reese, is a 40 y.o. male  BMW:[id number]  UVO:[id number]  DOB - 23-Jan-1976  CC:  Chief Complaint  Patient presents with  . Follow-up    hospital follow up for seizures, trying to get Medicaid, has not seen neurologist, has had headaches and difficulty with balance, using walker       HPI: Cory Reese is a 40 y.o. male here for follow-up hospital stay last month for a seizure. He abruptly stopped drinking alcohol leading to a seizure. He was treated with a standard withdrawal protocol and discharges on June 15. He reports he had a second seizure on the 16th but did not return to the hospital. He has had none since. He reports having no alcohol since his discharge. He was instructed to come here for follow-up. He reports having a problem with balance and weakness since discharge and is using a walker. He is awaiting medicaid approval. He has not seen a neurologist. He continues to suffer with back pain and sciatica. He also reports a flare of his ecezma.  Allergies  Allergen Reactions  . Doxycycline Rash   Past Medical History:  Diagnosis Date  . Back pain   . Depression with anxiety   . Eczema   . PTSD (post-traumatic stress disorder)    Current Outpatient Prescriptions on File Prior to Visit  Medication Sig Dispense Refill  . clobetasol (TEMOVATE) 0.05 % external solution Apply 1 application topically 2 (two) times daily. 50 mL 2  . cloNIDine (CATAPRES) 0.1 MG tablet Take 1 tablet (0.1 mg total) by mouth 2 (two) times daily. 60 tablet 3  . folic acid (FOLVITE) 1 MG tablet Take 1 tablet (1 mg total) by mouth daily.    Marland Kitchen HYDROcodone-acetaminophen (NORCO/VICODIN) 5-325 MG tablet Take 1 tablet by mouth every 6 (six) hours as needed for moderate pain. 10 tablet 0  . ibuprofen (ADVIL,MOTRIN) 400 MG tablet Take 1-2 tablets (400-800 mg total) by mouth every 8 (eight) hours as needed for moderate pain. 20 tablet 0  . Multiple Vitamin (MULTIVITAMIN WITH MINERALS) TABS tablet  Take 1 tablet by mouth daily.    . meloxicam (MOBIC) 15 MG tablet Take 1 tablet (15 mg total) by mouth daily. (Patient not taking: Reported on 04/07/2016) 30 tablet 1  . pantoprazole (PROTONIX) 40 MG tablet Take 1 tablet (40 mg total) by mouth daily. (Patient not taking: Reported on 05/22/2016) 30 tablet 3  . senna-docusate (SENOKOT-S) 8.6-50 MG tablet Take 1 tablet by mouth at bedtime as needed for mild constipation. (Patient not taking: Reported on 05/22/2016)    . thiamine 100 MG tablet Take 1 tablet (100 mg total) by mouth daily. (Patient not taking: Reported on 05/22/2016)    . triamcinolone (KENALOG) 0.025 % ointment Apply 1 application topically 2 (two) times daily. (Patient not taking: Reported on 08/19/2015) 80 g 0   No current facility-administered medications on file prior to visit.    Family History  Problem Relation Age of Onset  . Hypertension Mother   . Hypertension Father   . Hyperlipidemia Neg Hx   . Heart attack Neg Hx   . Diabetes Neg Hx   . Sudden death Neg Hx    Social History   Social History  . Marital status: Single    Spouse name: N/A  . Number of children: N/A  . Years of education: N/A   Occupational History  . Not on file.   Social History Main Topics  . Smoking status: Current Every Day Smoker  Packs/day: 0.50    Types: Cigarettes  . Smokeless tobacco: Never Used  . Alcohol use No     Comment: occ  . Drug use: No  . Sexual activity: Not on file   Other Topics Concern  . Not on file   Social History Narrative  . No narrative on file    Review of Systems: Constitutional: Negative for fever, chills, appetite change, weight loss. Positive for fatigue Skin: Positive for intermittent rash HENT: Negative for ear pain, ear discharge.nose bleeds Eyes: Negative for pain, discharge, redness, itching and visual disturbance. Neck: Negative for pain, stiffness Respiratory: Negative for cough, shortness of breath,   Cardiovascular: Positive non specific  for chest pain, palpitations and leg swelling. Gastrointestinal:Positive for intermittent  abdominal pain, nausea, vomiting, diarrhea. Genitourinary: Negative for dysuria, urgency, frequency, hematuria,  Musculoskeletal: Negative for back pain, joint pain, joint  swelling, and gait problem.Positive for knee pain and lower extremety weakness Neurological: Negative for dizziness, tremors, syncope,   light-headedness, numbness. Positive for seizures and headaches Hematological: Negative for easy bruising or bleeding Psychiatric/Behavioral: Positive for depression, anxiety,  Objective:   Vitals:   05/22/16 1005  BP: (!) 141/82  Pulse: 83  Temp: 98.2 F (36.8 C)    Physical Exam: Constitutional: Patient appears well-developed and well-nourished. No distress. HENT: Normocephalic, atraumatic, External right and left ear normal. Oropharynx is clear and moist.  Eyes: Conjunctivae and EOM are normal. PERRLA, no scleral icterus. Neck: Normal ROM. Neck supple. No lymphadenopathy, No thyromegaly. CVS: RRR, S1/S2 +, no murmurs, no gallops, no rubs Pulmonary: Effort and breath sounds normal, no stridor, rhonchi, wheezes, rales.  Abdominal: Soft. Normoactive BS,, no distension, tenderness, rebound or guarding.  Musculoskeletal: Normal range of motion. No edema and no tenderness. Walks using walker Neuro: Alert.Normal muscle tone coordination. Non-focal Skin: Skin is warm and dry. No rash noted. Not diaphoretic. No erythema. No pallor. Psychiatric: Normal mood and affect. Behavior, judgment, thought content normal.  Lab Results  Component Value Date   WBC 5.7 04/13/2016   HGB 11.9 (L) 04/13/2016   HCT 36.1 (L) 04/13/2016   MCV 97.3 04/13/2016   PLT 135 (L) 04/13/2016   Lab Results  Component Value Date   CREATININE 0.58 (L) 04/13/2016   BUN 5 (L) 04/13/2016   NA 137 04/13/2016   K 3.7 04/13/2016   CL 103 04/13/2016   CO2 25 04/13/2016    No results found for: HGBA1C Lipid Panel  No  results found for: CHOL, TRIG, HDL, CHOLHDL, VLDL, LDLCALC     Assessment and plan:   1. Alcohol abuse  - Vitamin B1 - Vitamin B6 - COMPLETE METABOLIC PANEL WITH GFR - CBC with Differential  2. Sciatica -referral when medicaid activated.         No Follow-up on file.  The patient was given clear instructions to go to ER or return to medical center if symptoms don't improve, worsen or new problems develop. The patient verbalized understanding.    Henrietta Hoover FNP  05/22/2016, 12:56 PMPatient ID: Herbert Moors, male   DOB: Jun 20, 1976, 40 y.o.   MRN: [id number]

## 2016-05-25 LAB — VITAMIN B6

## 2016-05-25 LAB — VITAMIN B1

## 2016-06-09 ENCOUNTER — Other Ambulatory Visit: Payer: Self-pay | Admitting: Family Medicine

## 2016-06-09 MED FILL — CLOBETASOL 0.05% SOLUTION: 0.05 | 25 days supply | Qty: 50 | Fill #0

## 2016-07-05 MED FILL — CLOBETASOL 0.05% SOLUTION: 0.05 | 25 days supply | Qty: 50 | Fill #1

## 2016-07-09 ENCOUNTER — Encounter (HOSPITAL_BASED_OUTPATIENT_CLINIC_OR_DEPARTMENT_OTHER): Payer: Self-pay | Admitting: Emergency Medicine

## 2016-07-09 ENCOUNTER — Emergency Department (HOSPITAL_BASED_OUTPATIENT_CLINIC_OR_DEPARTMENT_OTHER)
Admission: EM | Admit: 2016-07-09 | Discharge: 2016-07-10 | Disposition: A | Discharge status: Home / Self Care | Payer: Self-pay | Attending: Physician Assistant | Admitting: Physician Assistant

## 2016-07-09 ENCOUNTER — Emergency Department (HOSPITAL_BASED_OUTPATIENT_CLINIC_OR_DEPARTMENT_OTHER): Payer: Self-pay

## 2016-07-09 DIAGNOSIS — L0390 Cellulitis, unspecified: Secondary | ICD-10-CM

## 2016-07-09 DIAGNOSIS — L03116 Cellulitis of left lower limb: Secondary | ICD-10-CM | POA: Insufficient documentation

## 2016-07-09 DIAGNOSIS — Z79899 Other long term (current) drug therapy: Secondary | ICD-10-CM | POA: Insufficient documentation

## 2016-07-09 DIAGNOSIS — F17210 Nicotine dependence, cigarettes, uncomplicated: Secondary | ICD-10-CM | POA: Insufficient documentation

## 2016-07-09 DIAGNOSIS — L03115 Cellulitis of right lower limb: Secondary | ICD-10-CM | POA: Insufficient documentation

## 2016-07-09 DIAGNOSIS — L309 Dermatitis, unspecified: Secondary | ICD-10-CM | POA: Insufficient documentation

## 2016-07-09 DIAGNOSIS — R609 Edema, unspecified: Secondary | ICD-10-CM

## 2016-07-09 DIAGNOSIS — Z791 Long term (current) use of non-steroidal anti-inflammatories (NSAID): Secondary | ICD-10-CM | POA: Insufficient documentation

## 2016-07-09 DIAGNOSIS — I10 Essential (primary) hypertension: Secondary | ICD-10-CM | POA: Insufficient documentation

## 2016-07-09 HISTORY — DX: Essential (primary) hypertension: I10

## 2016-07-09 HISTORY — DX: Unspecified convulsions: R56.9

## 2016-07-09 MED ORDER — PREDNISONE 20 MG PO TABS: ORAL_TABLET | ORAL | 0 refills | Status: DC

## 2016-07-09 MED ORDER — CEPHALEXIN 250 MG PO CAPS
500.0000 mg | ORAL_CAPSULE | Freq: Once | ORAL | Status: AC
  Administered 2016-07-09: 500 mg via ORAL
  Filled 2016-07-09: qty 2

## 2016-07-09 MED ORDER — OXYCODONE-ACETAMINOPHEN 5-325 MG PO TABS
1.0000 | ORAL_TABLET | Freq: Once | ORAL | Status: AC
  Administered 2016-07-09: 1 via ORAL
  Filled 2016-07-09: qty 1

## 2016-07-09 MED ORDER — PREDNISONE 50 MG PO TABS
60.0000 mg | ORAL_TABLET | Freq: Once | ORAL | Status: AC
  Administered 2016-07-09: 60 mg via ORAL
  Filled 2016-07-09: qty 1

## 2016-07-09 MED ORDER — CEPHALEXIN 500 MG PO CAPS: 500.0000 mg | ORAL_CAPSULE | Freq: Four times a day (QID) | ORAL | 0 refills | Status: DC

## 2016-07-09 MED ORDER — OXYCODONE-ACETAMINOPHEN 5-325 MG PO TABS: 1.0000 | ORAL_TABLET | Freq: Four times a day (QID) | ORAL | 0 refills | Status: DC | PRN

## 2016-07-09 NOTE — ED Provider Notes (Signed)
[facility] Provider Note   CSN: [id number] Arrival date & time: 07/09/16  2015   By signing my name below, I, Arianna Nassar, attest that this documentation has been prepared under the direction and in the presence of Courteney Randall AnLyn Mackuen, MD.  Electronically Signed: Octavia HeirArianna Nassar, ED Scribe. 07/09/16. 9:39 PM.   History   Chief Complaint Chief Complaint  Patient presents with  . Leg Swelling   The history is provided by the patient. No language interpreter was used.   HPI Comments: Cory Reese is a 40 y.o. male who has a PMhx of HTN, back pain, eczema and hypokalemia presents to the Emergency Department complaining of sudden onset, gradual worsening, moderate bilateral leg swelling onset two days ago. He notes having sharp, burning sensation in his knees that radiate into his legs. He has also been "shaking" due to increased pain. He further expresses that he feels as if his eczema is getting worse and states he recently ran out of clobetasol. Pt says he has a hx of having similar episodes in the past but not this severe. He reports having increased pain when ambulating. He denies fever, hx of DVT, recent long car trips or laying down for a long period of time.     Past Medical History:  Diagnosis Date  . Back pain   . Depression with anxiety   . Eczema   . Hypertension   . PTSD (post-traumatic stress disorder)   . Seizures [facility](HCC)     Patient Active Problem List   Diagnosis Date Noted  . Seizure (HCC)   . Leg weakness, bilateral 04/12/2016  . Seizures (HCC) 04/12/2016  . Hypokalemia 04/12/2016  . Hypomagnesemia 04/12/2016  . Hypophosphatemia 04/12/2016  . Alcohol abuse 04/12/2016  . Transaminitis 04/12/2016  . Anemia 04/12/2016  . Thrombocytopenia (HCC) 04/12/2016  . Bilateral leg weakness   . Delirium tremens (HCC) 04/09/2016  . History of eczema 04/08/2015  . Chronic back pain 10/28/2013    Past Surgical History:  Procedure Laterality Date  .  KNEE SURGERY Left        Home Medications    Prior to Admission medications   Medication Sig Start Date End Date Taking? Authorizing Provider  clobetasol (TEMOVATE) 0.05 % external solution APPLY 1 APPLICATION TOPICALLY 2 TIMES DAILY. 06/09/16   Henrietta HooverLinda C Bernhardt, NP  cloNIDine (CATAPRES) 0.1 MG tablet Take 1 tablet (0.1 mg total) by mouth 2 (two) times daily. 04/13/16   Rodolph Bonganiel V Thompson, MD  folic acid (FOLVITE) 1 MG tablet Take 1 tablet (1 mg total) by mouth daily. 04/13/16   Rodolph Bonganiel V Thompson, MD  gabapentin (NEURONTIN) 100 MG capsule Take 100 mg by mouth 3 (three) times daily.    Historical Provider, MD  HYDROcodone-acetaminophen (NORCO/VICODIN) 5-325 MG tablet Take 1 tablet by mouth every 6 (six) hours as needed for moderate pain. 04/13/16   Rodolph Bonganiel V Thompson, MD  ibuprofen (ADVIL,MOTRIN) 400 MG tablet Take 1-2 tablets (400-800 mg total) by mouth every 8 (eight) hours as needed for moderate pain. 04/13/16   Rodolph Bonganiel V Thompson, MD  meloxicam (MOBIC) 15 MG tablet Take 1 tablet (15 mg total) by mouth daily. Patient not taking: Reported on 04/07/2016 12/10/15   Henrietta HooverLinda C Bernhardt, NP  Multiple Vitamin (MULTIVITAMIN WITH MINERALS) TABS tablet Take 1 tablet by mouth daily.    Historical Provider, MD  pantoprazole (PROTONIX) 40 MG tablet Take 1 tablet (40 mg total) by mouth daily. Patient not taking: Reported on 05/22/2016 04/13/16  Rodolph Bong, MD  senna-docusate (SENOKOT-S) 8.6-50 MG tablet Take 1 tablet by mouth at bedtime as needed for mild constipation. Patient not taking: Reported on 05/22/2016 04/13/16   Rodolph Bong, MD  thiamine 100 MG tablet Take 1 tablet (100 mg total) by mouth daily. Patient not taking: Reported on 05/22/2016 04/13/16   Rodolph Bong, MD  triamcinolone (KENALOG) 0.025 % ointment Apply 1 application topically 2 (two) times daily. Patient not taking: Reported on 08/19/2015 06/02/15   Rolan Bucco, MD    Family History Family History  Problem Relation Age of  Onset  . Hypertension Mother   . Hypertension Father   . Hyperlipidemia Neg Hx   . Heart attack Neg Hx   . Diabetes Neg Hx   . Sudden death Neg Hx     Social History Social History  Substance Use Topics  . Smoking status: Current Every Day Smoker    Packs/day: 0.50    Types: Cigarettes  . Smokeless tobacco: Never Used  . Alcohol use Yes     Comment: occ     Allergies   Doxycycline   Review of Systems Review of Systems  A complete 10 system review of systems was obtained and all systems are negative except as noted in the HPI and PMH.   Physical Exam Updated Vital Signs BP (!) 179/111 (BP Location: Left Arm)   Pulse 104   Temp 98.6 F (37 C) (Oral)   Resp 18   Ht 6\' 3"  (1.905 m)   Wt 165 lb (74.8 kg)   SpO2 100%   BMI 20.62 kg/m   Physical Exam  Constitutional: He is oriented to person, place, and time. He appears well-developed and well-nourished.  HENT:  Head: Normocephalic.  Eyes: EOM are normal.  Neck: Normal range of motion.  Cardiovascular: Normal rate and regular rhythm.   Pulmonary/Chest: Effort normal.  Abdominal: He exhibits no distension.  Musculoskeletal: Normal range of motion. He exhibits edema.  2+ pitting edema, left worse than right.  Neurological: He is alert and oriented to person, place, and time.  Skin:  bilateral chronic excoriations with chronic eczematous changes, mild overlying erythema  Psychiatric: He has a normal mood and affect.  Nursing note and vitals reviewed.    ED Treatments / Results  DIAGNOSTIC STUDIES: Oxygen Saturation is 100% on RA, normal by my interpretation.  COORDINATION OF CARE:  9:37 PM Discussed treatment plan with pt at bedside and pt agreed to plan.  Labs (all labs ordered are listed, but only abnormal results are displayed) Labs Reviewed - No data to display  EKG  EKG Interpretation None       Radiology No results found.  Procedures Procedures (including critical care  time)  Medications Ordered in ED Medications - No data to display   Initial Impression / Assessment and Plan / ED Course  I have reviewed the triage vital signs and the nursing notes.  Pertinent labs & imaging results that were available during my care of the patient were reviewed by me and considered in my medical decision making (see chart for details).  Clinical Course   Patient is a 40 year old male with history of eczema. Patient has eczema on bilateral lower extremities that he's been itching. This chronic excoriations seen. Patient is overlying mild erythema to left. Left swelling greater than right. We will get ultrasound  DVT otherwise we'll treat with prednisone for eczema and Keflex for overlying mild potential cellulitis. With outpatient follow-up with his primary  care physician. His main complaint today however is most pain. We'll give some pain pills to help with pain until the prednisone helps decrease infalmmation.  Final Clinical Impressions(s) / ED Diagnoses   Final diagnoses:  None   I personally performed the services described in this documentation, which was scribed in my presence. The recorded information has been reviewed and is accurate.     New Prescriptions New Prescriptions   No medications on file     Courteney Randall An, MD 07/09/16 2352

## 2016-07-09 NOTE — ED Triage Notes (Addendum)
Patient reports back and leg swelling.  Reports this as painful. States this began a few days ago.  Reports drainage from legs as well.  Reports history of high blood pressure but states that he has not taken his blood pressure medication due to being out.

## 2016-08-16 MED FILL — CLOBETASOL 0.05% SOLUTION: 0.05 | 25 days supply | Qty: 50 | Fill #2

## 2016-12-17 IMAGING — CT CT HEAD W/O CM
4 series · 16 of 47 positions shown, 18 images · non-contrast
Comparison: None.

CLINICAL DATA: Acute onset of seizure, with shaking and foaming at
the mouth. Initial encounter.

EXAM:
CT HEAD WITHOUT CONTRAST
TECHNIQUE: Contiguous axial images were obtained from the base of the skull
through the vertex without intravenous contrast.

[Series 2: head without · axial · non-contrast · 0.44mm/px · z∈[-62,+48]mm · 7 of 30 slices shown, 9 images]
[im 4/30  brain]
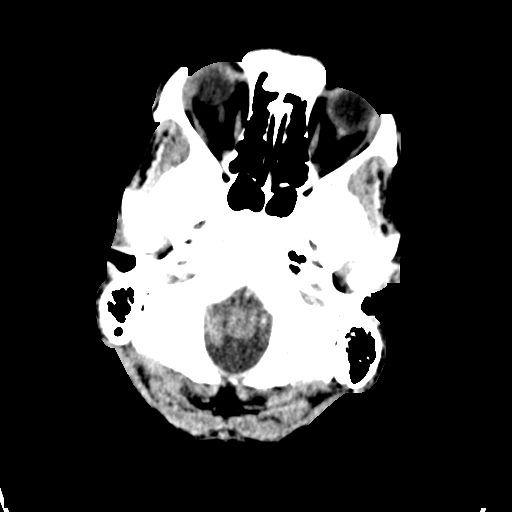
[im 4/30  bone]
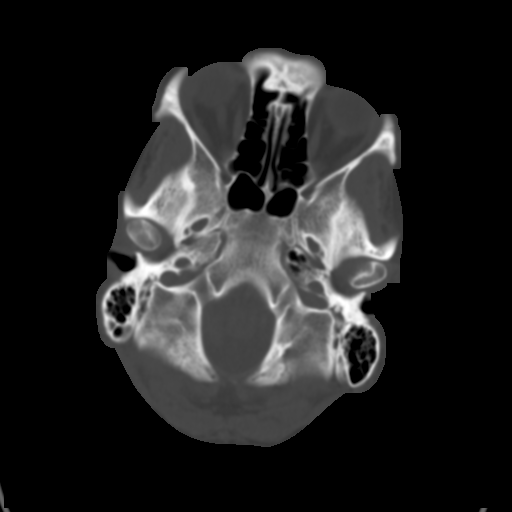
[im 8/30  brain]
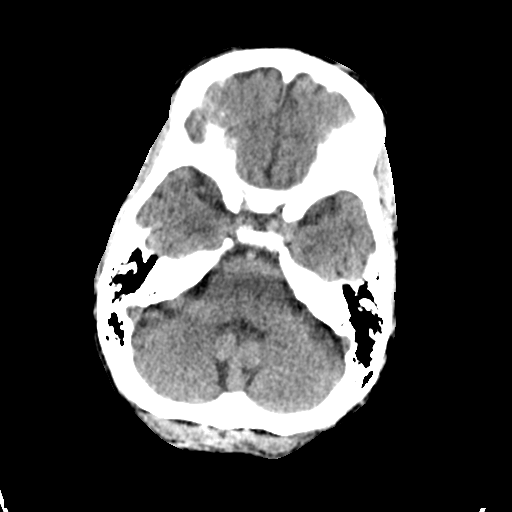
[im 11/30  brain]
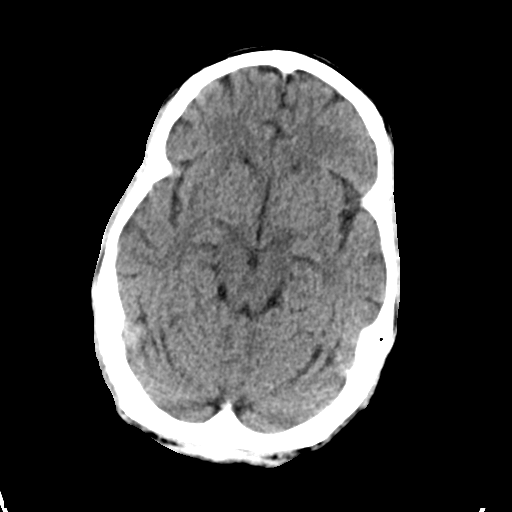
[im 15/30  brain]
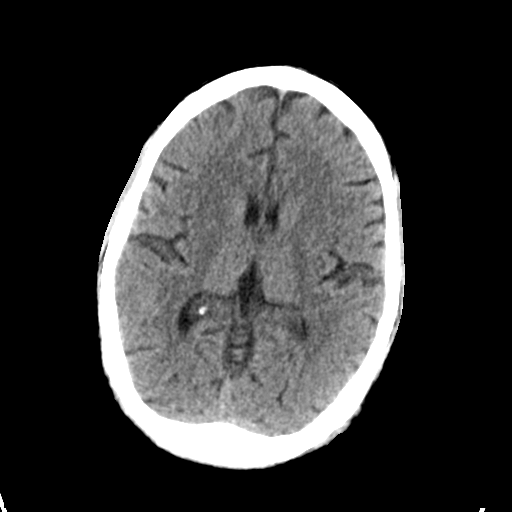
[im 19/30  brain]
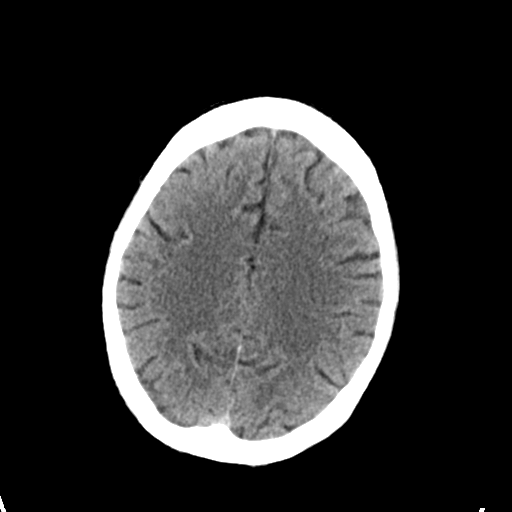
[im 19/30  bone]
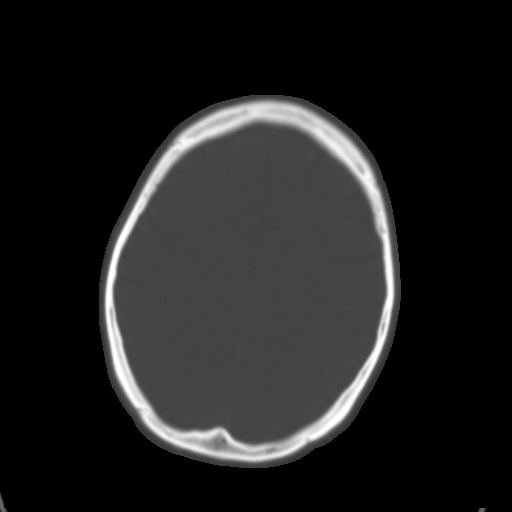
[im 22/30  brain]
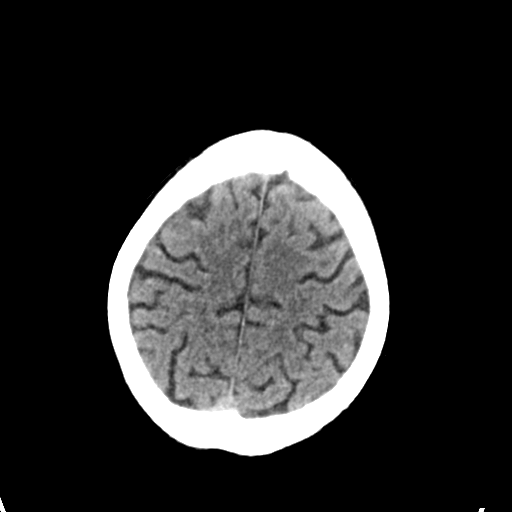
[im 26/30  brain]
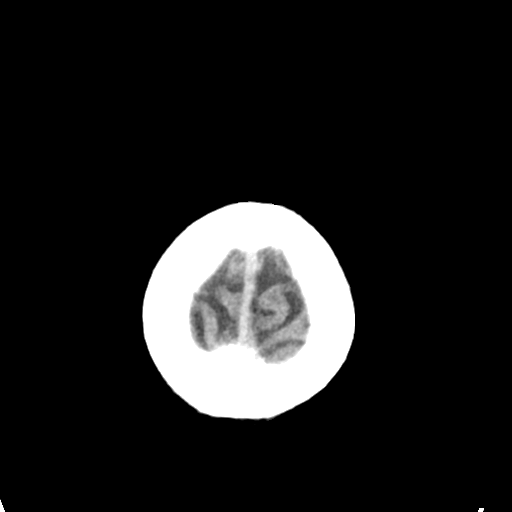

[Series 3: head bone · axial · 0.44mm/px · z∈[-63,-35]mm · 3 of 73 slices shown]
[im 8/73  bone]
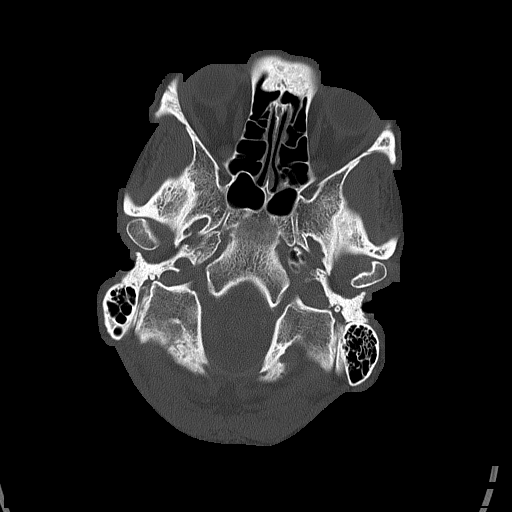
[im 15/73  bone]
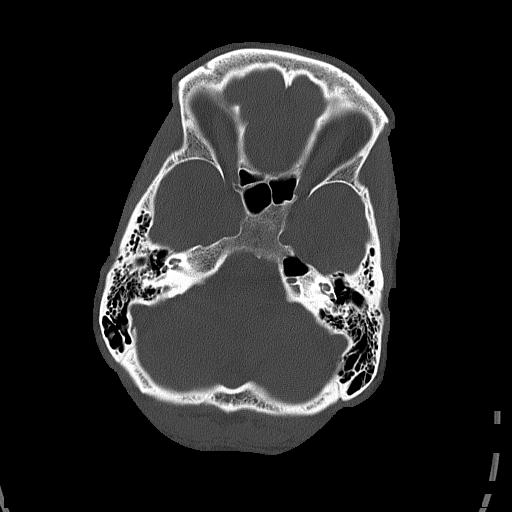
[im 22/73  bone]
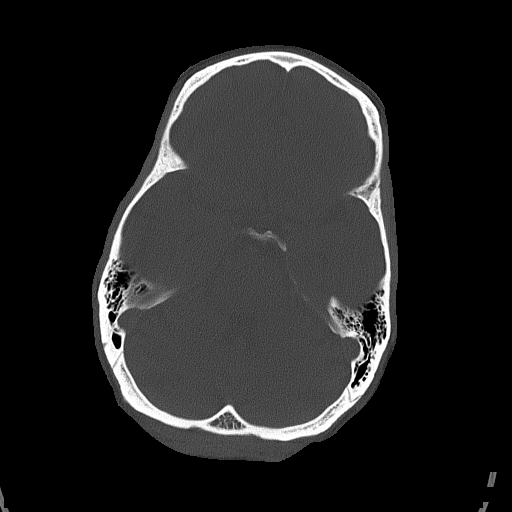

[Series 4: head without cor · coronal · non-contrast · 0.31mm/px · 3 of 69 slices shown]
[im 23/69  brain]
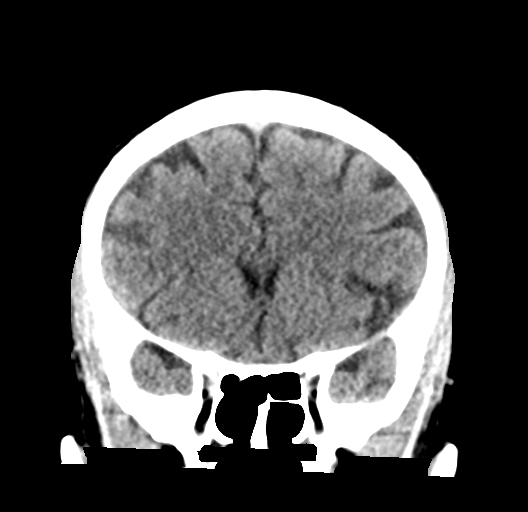
[im 31/69  brain]
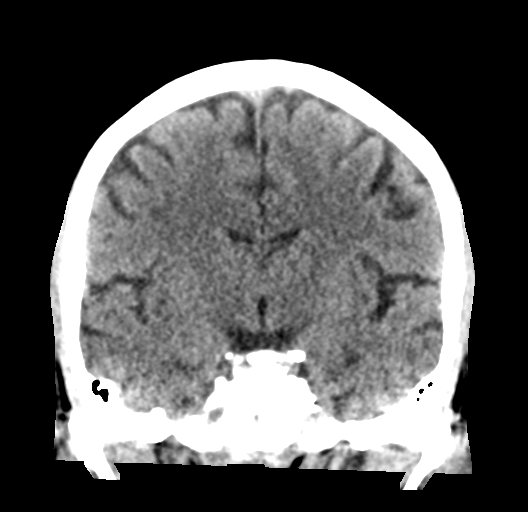
[im 38/69  brain]
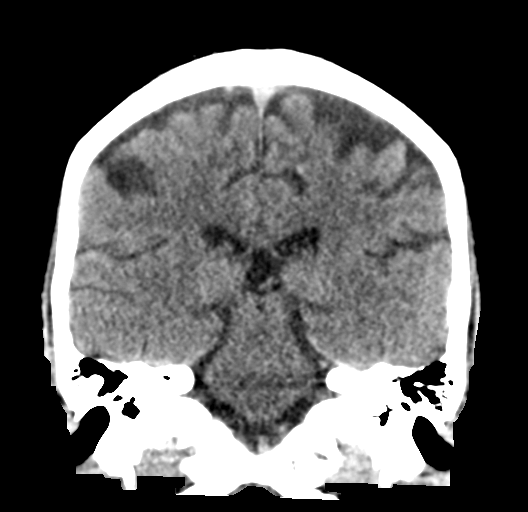

[Series 5: head without sag · sagittal · non-contrast · 0.28mm/px · 3 of 56 slices shown]
[im 19/56  brain]
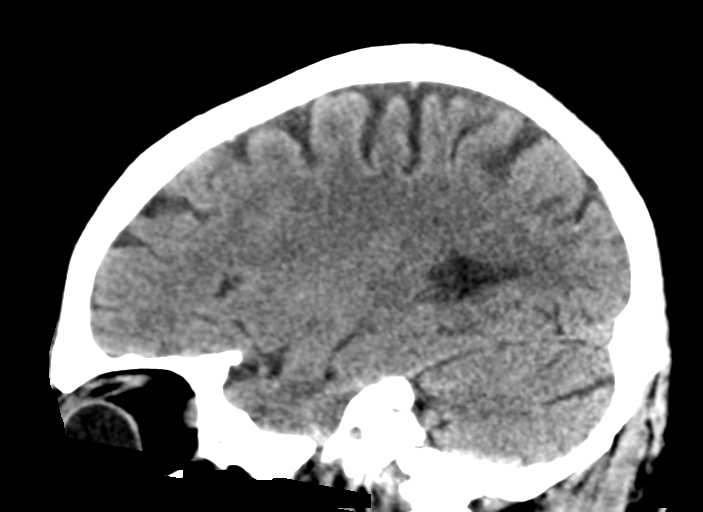
[im 28/56  brain]
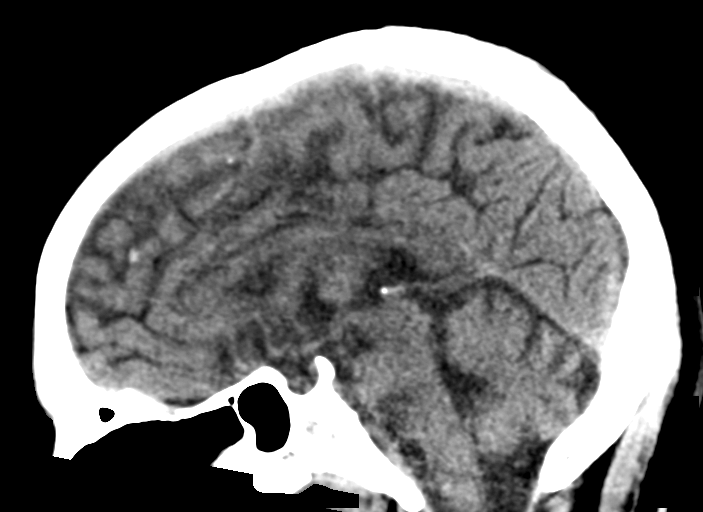
[im 37/56  brain]
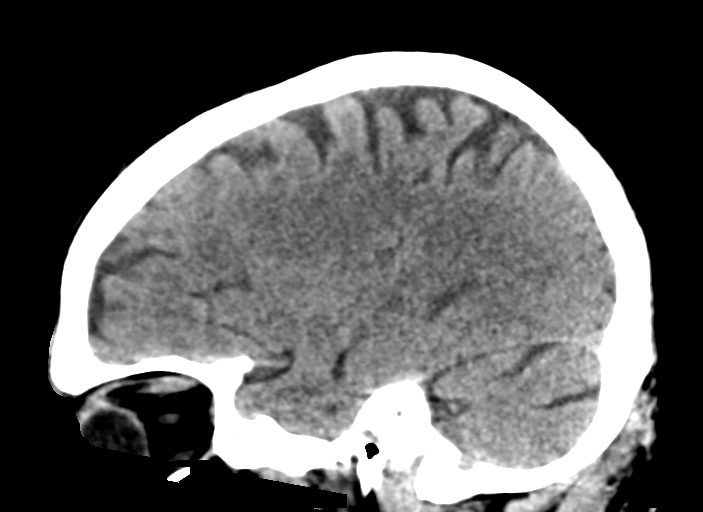

[16 of 47 positions shown; findings below may reference images not displayed]

FINDINGS: There is no evidence of acute infarction, mass lesion, or intra- or
extra-axial hemorrhage on CT.

The posterior fossa, including the cerebellum, brainstem and fourth
ventricle, is within normal limits. The third and lateral
ventricles, and basal ganglia are unremarkable in appearance. The
cerebral hemispheres are symmetric in appearance, with normal
gray-white differentiation. No mass effect or midline shift is seen.

There is no evidence of fracture; visualized osseous structures are
unremarkable in appearance. The visualized portions of the orbits
are within normal limits. The paranasal sinuses and mastoid air
cells are well-aerated. No significant soft tissue abnormalities are
seen.
IMPRESSION: Unremarkable noncontrast CT of the head.

## 2016-12-20 IMAGING — MR MR THORACIC SPINE W/O CM
1 series · 11 of 11 positions shown · non-contrast
Comparison: Prior CT from 04/05/2015.

CLINICAL DATA: Initial evaluation for acute bilateral lower
extremity weakness.

EXAM:
MRI THORACIC SPINE WITHOUT CONTRAST
TECHNIQUE: Multiplanar, multisequence MR imaging of the thoracic spine was
performed. No intravenous contrast was administered.

[Series 2: counting loc · sagittal · 4.0mm · 0.94mm/px · 11 of 11 slices shown]
[im 1/11]
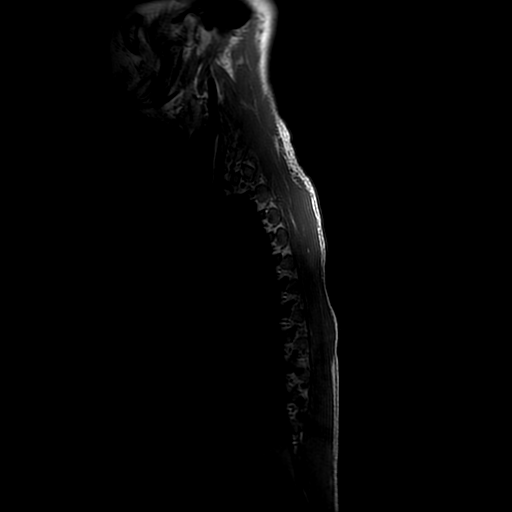
[im 2/11]
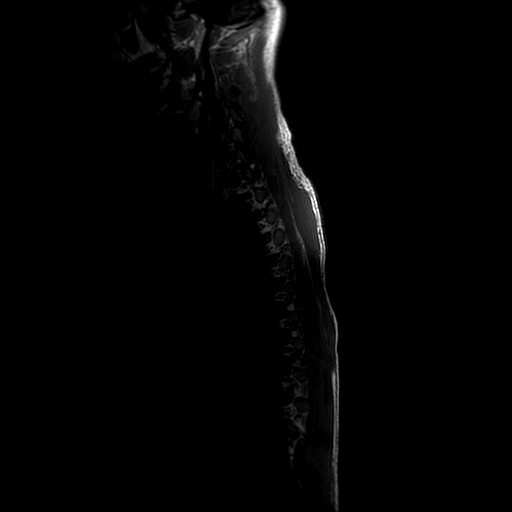
[im 3/11]
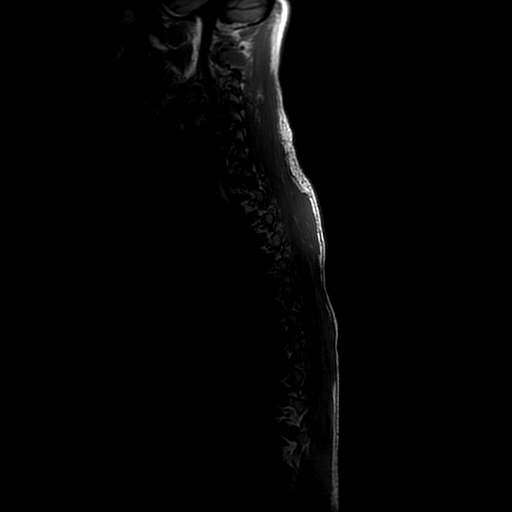
[im 4/11]
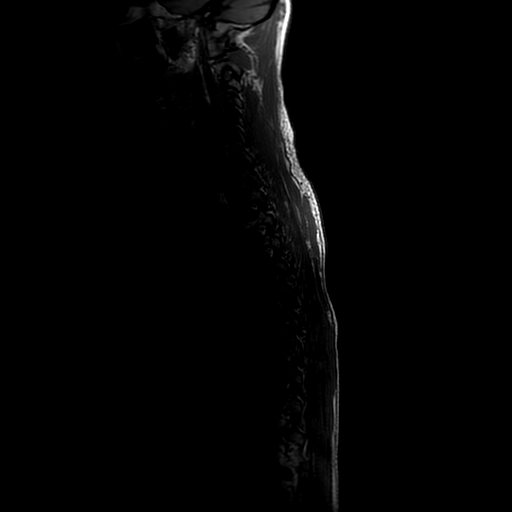
[im 5/11]
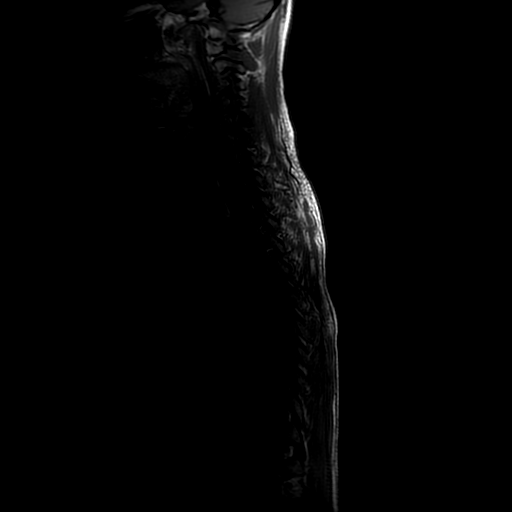
[im 6/11]
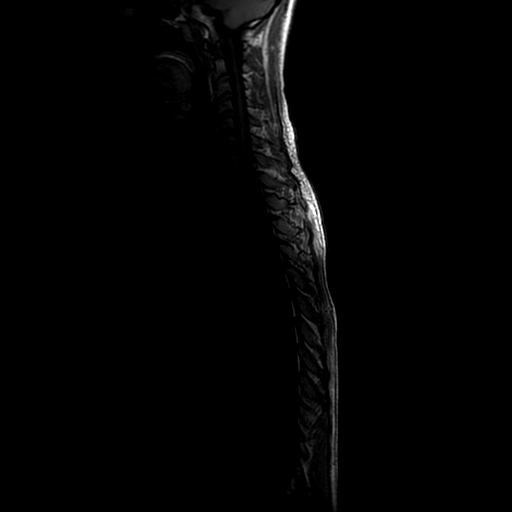
[im 7/11]
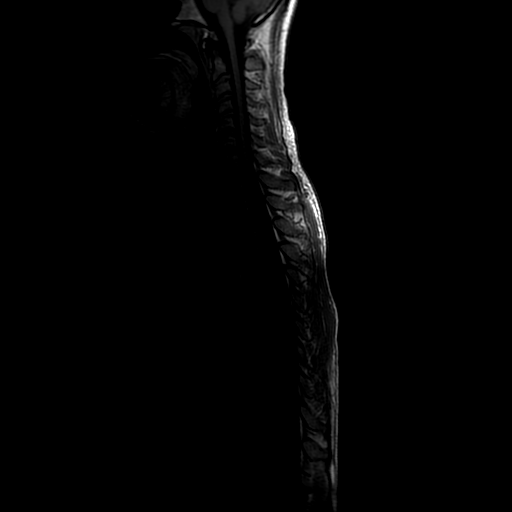
[im 8/11]
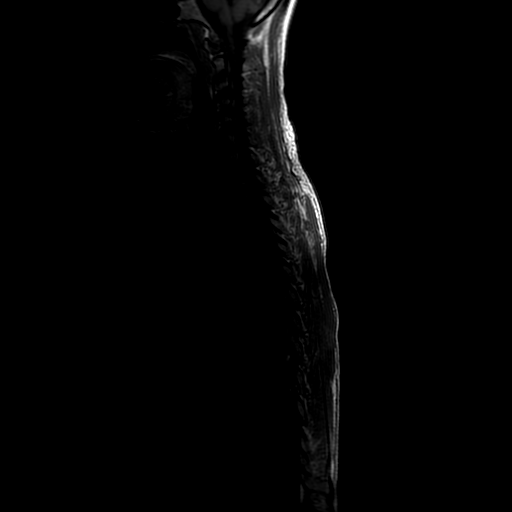
[im 9/11]
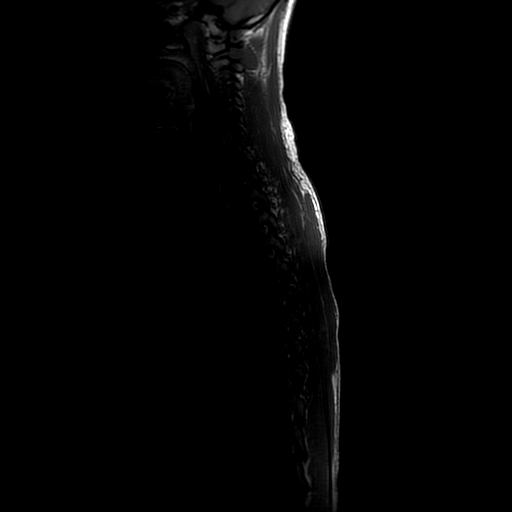
[im 10/11]
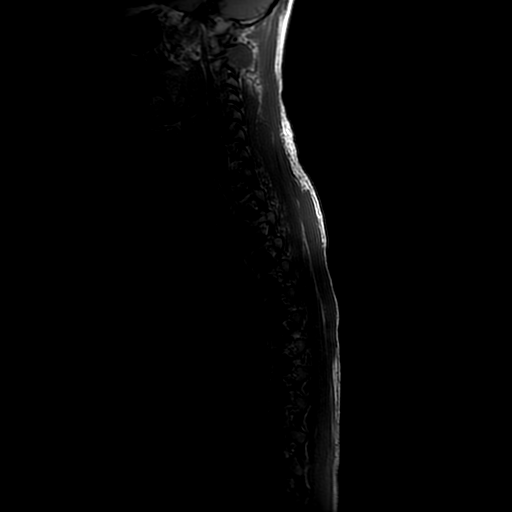
[im 11/11]
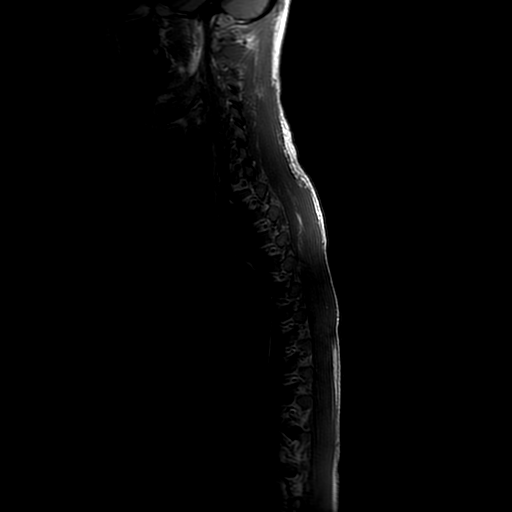

[11 of 11 positions shown; findings below may reference images not displayed]

FINDINGS: Alignment: Vertebral bodies are normally aligned with preservation
of the normal thoracic kyphosis. No listhesis or malalignment.

Vertebrae: Vertebral body height is well preserved. Signal intensity
within the vertebral body bone marrow is normal. No acute or chronic
fracture. No focal osseous lesions. No marrow edema.

Cord:  Signal intensity within the thoracic spinal cord is normal.

Paraspinal and other soft tissues: Paraspinous soft tissues
demonstrate no acute abnormality. Visualized lungs are clear. Mild
multilevel degenerative spondylolysis noted within the partially
visualized upper cervical spine on counter sequence. No significant
stenosis.

Disc levels:

C7-T1:. Shallow broad-based posterior disc bulge flattens the
ventral thecal sac without significant stenosis.

T7-8: Mild left-sided posterior element hypertrophy without
stenosis.

T8-9: Mild left-sided posterior element hypertrophy without
stenosis.

T10-11: Mild disc desiccation without significant disc bulge or
focal disc herniation. No stenosis.

No other significant degenerative changes noted within the thoracic
spine.
IMPRESSION: 1. Essentially normal MRI of the thoracic spine. No abnormal cord
signal identified.
2. Minimal multilevel degenerative changes as above without
significant stenosis.
3. Shallow posterior disc protrusion at C7-T1 without significant
stenosis.

## 2018-01-29 ENCOUNTER — Other Ambulatory Visit: Payer: Self-pay

## 2018-01-29 ENCOUNTER — Encounter (HOSPITAL_BASED_OUTPATIENT_CLINIC_OR_DEPARTMENT_OTHER): Payer: Self-pay | Admitting: Emergency Medicine

## 2018-01-29 ENCOUNTER — Emergency Department (HOSPITAL_BASED_OUTPATIENT_CLINIC_OR_DEPARTMENT_OTHER)
Admission: EM | Admit: 2018-01-29 | Discharge: 2018-01-29 | Disposition: A | Discharge status: Home / Self Care | Payer: Self-pay | Attending: Emergency Medicine | Admitting: Emergency Medicine

## 2018-01-29 DIAGNOSIS — M5441 Lumbago with sciatica, right side: Secondary | ICD-10-CM | POA: Insufficient documentation

## 2018-01-29 DIAGNOSIS — R202 Paresthesia of skin: Secondary | ICD-10-CM | POA: Insufficient documentation

## 2018-01-29 DIAGNOSIS — F17210 Nicotine dependence, cigarettes, uncomplicated: Secondary | ICD-10-CM | POA: Insufficient documentation

## 2018-01-29 DIAGNOSIS — M62830 Muscle spasm of back: Secondary | ICD-10-CM | POA: Insufficient documentation

## 2018-01-29 DIAGNOSIS — G8929 Other chronic pain: Secondary | ICD-10-CM | POA: Insufficient documentation

## 2018-01-29 DIAGNOSIS — Z79899 Other long term (current) drug therapy: Secondary | ICD-10-CM | POA: Insufficient documentation

## 2018-01-29 DIAGNOSIS — I10 Essential (primary) hypertension: Secondary | ICD-10-CM | POA: Insufficient documentation

## 2018-01-29 DIAGNOSIS — L309 Dermatitis, unspecified: Secondary | ICD-10-CM | POA: Insufficient documentation

## 2018-01-29 MED ORDER — PREDNISONE 20 MG PO TABS: ORAL_TABLET | ORAL | 0 refills | Status: DC

## 2018-01-29 MED ORDER — CYCLOBENZAPRINE HCL 10 MG PO TABS
10.0000 mg | ORAL_TABLET | Freq: Three times a day (TID) | ORAL | 0 refills | Status: DC | PRN
Start: 2018-01-29 — End: 2022-11-22

## 2018-01-29 MED ORDER — TRIAMCINOLONE ACETONIDE 0.1 % EX CREA: 1.0000 "application " | TOPICAL_CREAM | Freq: Two times a day (BID) | CUTANEOUS | 0 refills | Status: DC

## 2018-01-29 MED FILL — predniSONE 20 MG TABS: 20 | 4 days supply | Qty: 12 | Fill #0

## 2018-01-29 MED FILL — CYCLOBENZAPRINE HCL 10 MG T: 10 | 5 days supply | Qty: 15 | Fill #0

## 2018-01-29 MED FILL — TRIAMCINOLONE 0.1% CREAM: 0.1 | 15 days supply | Qty: 30 | Fill #0

## 2018-01-29 NOTE — ED Triage Notes (Signed)
Right sided "Sciatica" for months.  Sts he can't lay in his right side and having trouble sleeping.  Both ankles having exacerbation of eczema for about a week.

## 2018-01-29 NOTE — Discharge Instructions (Signed)
For your eczema: Use Zyrtec for allergy symptoms of sneezing and runny nose everyday, use triamcinolone cream for itching or rash, and continue your usual home medications. Get plenty of rest, drink plenty of fluids, shower with cool water (no hot steamy showers); use soap only on your "bits" (underarms and bottom); use cetaphil cleanser or dove soap when using soap; use eucerin or aquaphor lotion after every bath/shower. Please followup with your primary doctor for discussion of your diagnoses and further evaluation after today's visit; if you do not have a primary care doctor use the resource guide provided to find one; Also follow-[facility] as listed in your discharge instructions. Return to the ER for changes or worsening symptoms   For your Back Pain: Your back pain should be treated with medicines such as ibuprofen or aleve and this back pain should get better over the next 2 weeks.  However if you develop severe or worsening pain, low back pain with fever, numbness, weakness or inability to walk or urinate, you should return to the ER immediately.  Please follow up with your doctor this week for a recheck if still having symptoms.  Avoid heavy lifting over 10 pounds over the next two weeks.  Low back pain is discomfort in the lower back that may be due to injuries to muscles and ligaments around the spine.  Occasionally, it may be caused by a a problem to a part of the spine called a disc.  The pain may last several days or a week;  However, most patients get completely well in 4 weeks.  Self - care:  The application of heat can help soothe the pain.  Maintaining your daily activities, including walking, is encourged, as it will help you get better faster than just staying in bed. Perform gentle stretching as discussed. Drink plenty of fluids.  Medications are also useful to help with pain control.  A commonly prescribed medication includes over the counter  Tylenol; take as directed on the bottle.   Non steroidal anti inflammatory medications including Ibuprofen and naproxen;  These medications help both pain and swelling and are very useful in treating back pain.  They should be taken with food, as they can cause stomach upset, and more seriously, stomach bleeding.    Muscle relaxants:  These medications can help with muscle tightness that is a cause of lower back pain.  Most of these medications can cause drowsiness, and it is not safe to drive or use dangerous machinery while taking them.  Prednisone: take prednisone as directed until completed, this should help with your sciatica and with your eczema.   SEEK IMMEDIATE MEDICAL ATTENTION IF: New numbness, tingling, weakness, or problem with the use of your arms or legs.  Severe back pain not relieved with medications.  Difficulty with or loss of control of your bowel or bladder control.  Increasing pain in any areas of the body (such as chest or abdominal pain).  Shortness of breath, dizziness or fainting.  Nausea (feeling sick to your stomach), vomiting, fever, or sweats.  You will need to follow up with  Your primary healthcare provider in 1-2 weeks for reassessment.

## 2018-01-29 NOTE — ED Provider Notes (Signed)
[facility] EMERGENCY DEPARTMENT Provider Note   CSN: [id number] Arrival date & time: 01/29/18  [id number]     History   Chief Complaint Chief Complaint  Patient presents with  . Sciatica and Eczema    HPI Cory Reese is a 42 y.o. male with a PMHx of chronic back pain, depression, anxiety, HTN, eczema, and other conditions listed below, who presents to the ED with 2 separate complaints.  His first complaint is chronic low back pain and sciatica which has been bothering him for the last 1 month.  He describes the pain as 10/10 constant sharp right lower back pain that radiates into the right posterior leg, worse with laying on his right side, and unrelieved with Advil.  He reports associated tingling in bilateral feet.  He states this feels the same as his prior sciatica issues.  He reports doing a lot of repetitive bending cleaning the house recently.  Secondly he complains of eczema to bilateral arms and ankles which is itchy and unrelieved with Vaseline.  He has used steroid cream in the past which helps.  He has not seen a dermatologist for this.  He denies any other associated symptoms related to this.  He denies fevers, chills, CP, SOB, abd pain, N/V/D/C, hematuria, dysuria, incontinence of urine/stool, saddle anesthesia/cauda equina symptoms, myalgias, arthralgias, numbness, focal weakness, or any other complaints at this time.  He denies hx of CA or IVDU.   The history is provided by the patient and medical records. No language interpreter was used.  Back Pain   This is a chronic problem. The current episode started more than 1 week ago. The problem occurs constantly. The problem has not changed since onset.Associated with: repetitive bending. The pain is present in the lumbar spine. Quality: sharp. The pain radiates to the right thigh. The pain is at a severity of 10/10. The pain is moderate. The symptoms are aggravated by certain positions. The pain is the same all the time.  Associated symptoms include tingling (b/l feet). Pertinent negatives include no chest pain, no fever, no numbness, no abdominal pain, no bowel incontinence, no perianal numbness, no bladder incontinence, no dysuria, no paresthesias, no paresis and no weakness. He has tried NSAIDs for the symptoms. The treatment provided no relief.    Past Medical History:  Diagnosis Date  . Back pain   . Depression with anxiety   . Eczema   . Hypertension   . PTSD (post-traumatic stress disorder)   . Seizures [facility](HCC)     Patient Active Problem List   Diagnosis Date Noted  . Seizure (HCC)   . Leg weakness, bilateral 04/12/2016  . Seizures (HCC) 04/12/2016  . Hypokalemia 04/12/2016  . Hypomagnesemia 04/12/2016  . Hypophosphatemia 04/12/2016  . Alcohol abuse 04/12/2016  . Transaminitis 04/12/2016  . Anemia 04/12/2016  . Thrombocytopenia (HCC) 04/12/2016  . Bilateral leg weakness   . Delirium tremens (HCC) 04/09/2016  . History of eczema 04/08/2015  . Chronic back pain 10/28/2013    Past Surgical History:  Procedure Laterality Date  . KNEE SURGERY Left         Home Medications    Prior to Admission medications   Medication Sig Start Date End Date Taking? Authorizing Provider  cephALEXin (KEFLEX) 500 MG capsule Take 1 capsule (500 mg total) by mouth 4 (four) times daily. 07/09/16   Mackuen, Courteney Lyn, MD  clobetasol (TEMOVATE) 0.05 % external solution APPLY 1 APPLICATION TOPICALLY 2 TIMES DAILY. 06/09/16   Concepcion LivingBernhardt, Linda  C, NP  cloNIDine (CATAPRES) 0.1 MG tablet Take 1 tablet (0.1 mg total) by mouth 2 (two) times daily. 04/13/16   Rodolph Bong, MD  folic acid (FOLVITE) 1 MG tablet Take 1 tablet (1 mg total) by mouth daily. 04/13/16   Rodolph Bong, MD  gabapentin (NEURONTIN) 100 MG capsule Take 100 mg by mouth 3 (three) times daily.    [provider]  HYDROcodone-acetaminophen (NORCO/VICODIN) 5-325 MG tablet Take 1 tablet by mouth every 6 (six) hours as needed for  moderate pain. 04/13/16   Rodolph Bong, MD  ibuprofen (ADVIL,MOTRIN) 400 MG tablet Take 1-2 tablets (400-800 mg total) by mouth every 8 (eight) hours as needed for moderate pain. 04/13/16   Rodolph Bong, MD  meloxicam (MOBIC) 15 MG tablet Take 1 tablet (15 mg total) by mouth daily. Patient not taking: Reported on 04/07/2016 12/10/15   Henrietta Hoover, NP  Multiple Vitamin (MULTIVITAMIN WITH MINERALS) TABS tablet Take 1 tablet by mouth daily.    [provider]  oxyCODONE-acetaminophen (PERCOCET/ROXICET) 5-325 MG tablet Take 1 tablet by mouth every 6 (six) hours as needed for severe pain. 07/09/16   Mackuen, Courteney Lyn, MD  pantoprazole (PROTONIX) 40 MG tablet Take 1 tablet (40 mg total) by mouth daily. Patient not taking: Reported on 05/22/2016 04/13/16   Rodolph Bong, MD  predniSONE (DELTASONE) 20 MG tablet Day 1 and 2: Take 3 tabs  Day 3-5: Take 2 tabs.  Day 5-8: take 1 tab 07/09/16   Mackuen, Courteney Lyn, MD  senna-docusate (SENOKOT-S) 8.6-50 MG tablet Take 1 tablet by mouth at bedtime as needed for mild constipation. Patient not taking: Reported on 05/22/2016 04/13/16   Rodolph Bong, MD  thiamine 100 MG tablet Take 1 tablet (100 mg total) by mouth daily. Patient not taking: Reported on 05/22/2016 04/13/16   Rodolph Bong, MD  triamcinolone (KENALOG) 0.025 % ointment Apply 1 application topically 2 (two) times daily. Patient not taking: Reported on 08/19/2015 06/02/15   Rolan Bucco, MD    Family History Family History  Problem Relation Age of Onset  . Hypertension Mother   . Hypertension Father   . Hyperlipidemia Neg Hx   . Heart attack Neg Hx   . Diabetes Neg Hx   . Sudden death Neg Hx     Social History Social History   Tobacco Use  . Smoking status: Current Every Day Smoker    Packs/day: 0.50    Types: Cigarettes  . Smokeless tobacco: Never Used  Substance Use Topics  . Alcohol use: Yes    Comment: occ  . Drug use: No     Allergies     Doxycycline   Review of Systems Review of Systems  Constitutional: Negative for chills and fever.  Respiratory: Negative for shortness of breath.   Cardiovascular: Negative for chest pain.  Gastrointestinal: Negative for abdominal pain, bowel incontinence, constipation, diarrhea, nausea and vomiting.  Genitourinary: Negative for bladder incontinence, difficulty urinating (no incontinence), dysuria and hematuria.  Musculoskeletal: Positive for back pain. Negative for arthralgias and myalgias.  Skin: Positive for rash.  Allergic/Immunologic: Negative for immunocompromised state.  Neurological: Positive for tingling (b/l feet). Negative for weakness, numbness and paresthesias.  Psychiatric/Behavioral: Negative for confusion.   All other systems reviewed and are negative for acute change except as noted in the HPI.    Physical Exam Updated Vital Signs BP (!) 147/98 (BP Location: Right Arm)   Pulse (!) 116   Temp 98.2 F (36.8  C) (Oral)   Resp (!) 22   Ht 6\' 3"  (1.905 m)   Wt 79.4 kg (175 lb)   SpO2 96%   BMI 21.87 kg/m   Physical Exam  Constitutional: He is oriented to person, place, and time. Vital signs are normal. He appears well-developed and well-nourished.  Non-toxic appearance. No distress.  Afebrile, nontoxic, NAD  HENT:  Head: Normocephalic and atraumatic.  Mouth/Throat: Oropharynx is clear and moist and mucous membranes are normal.  Eyes: Conjunctivae and EOM are normal. Right eye exhibits no discharge. Left eye exhibits no discharge.  Neck: Normal range of motion. Neck supple.  Cardiovascular: Normal rate and intact distal pulses.  Tachycardic in triage, however this resolved upon exam  Pulmonary/Chest: Effort normal and breath sounds normal. No respiratory distress.  Abdominal: Normal appearance. He exhibits no distension.  Musculoskeletal: Normal range of motion.       Lumbar back: He exhibits tenderness and spasm. He exhibits normal range of motion and no bony  tenderness.  Lumbar spine with FROM intact without spinous process TTP, no bony stepoffs or deformities, with mild R sided paraspinous muscle TTP and muscle spasms. Strength and sensation grossly intact in all extremities, +SLR on R side, gait steady and nonantalgic. No overlying skin changes. Distal pulses intact.   Neurological: He is alert and oriented to person, place, and time. He has normal strength. No sensory deficit.  Skin: Skin is warm, dry and intact. Rash noted.  Eczematous rash to b/l ankles and antecubital fossas as well as the nape of the neck, excoriations noted, no evidence of secondary infection/cellulitis.   Psychiatric: He has a normal mood and affect.  Nursing note and vitals reviewed.    ED Treatments / Results  Labs (all labs ordered are listed, but only abnormal results are displayed) Labs Reviewed - No data to display  EKG None  Radiology No results found.  Procedures Procedures (including critical care time)  Medications Ordered in ED Medications - No data to display   Initial Impression / Assessment and Plan / ED Course  I have reviewed the triage vital signs and the nursing notes.  Pertinent labs & imaging results that were available during my care of the patient were reviewed by me and considered in my medical decision making (see chart for details).     42 y.o. male here with 2 separate issues; one is his chronic R lower back pain and sciatica, and the other is his eczema acting up. On exam, eczema rash to b/l ankles, chronic excoriations, no evidence of cellulitis or secondary infection; also with some excoriations on his neck and b/l antecubital fossas. On spinal exam, mild diffuse R lower paraspinous muscle TTP and spasm, +SLR on R side; no midline spinal tenderness. No red flag s/s of low back pain. No s/s of central cord compression or cauda equina. Lower extremities are neurovascularly intact and patient is ambulating without difficulty. No  urinary complaints. Doubt need for imaging/labs, likely muscular strain and sciatica.  Patient was counseled on back pain precautions and told to do activity as tolerated but do not lift, push, or pull heavy objects more than 10 pounds for the next week. Patient counseled to use ice or heat on back for no longer than 15 minutes every hour.   Rx given for muscle relaxer and counseled on proper use of muscle relaxant medication. Urged patient not to drink alcohol, drive, or perform any other activities that requires focus while taking these medications. Advised tylenol  and ibuprofen use as well. Will treat with prednisone for sciatica, which will also help with eczema. Will also give triamcinolone cream as well. Discussed other OTC remedies to help with both issues.   Patient urged to follow-up with PCP in 1wk for recheck, or sooner if pain does not improve with treatment and rest or if pain becomes recurrent. Urged to return with worsening severe pain, loss of bowel or bladder control, trouble walking. The patient verbalizes understanding and agrees with the plan.    Final Clinical Impressions(s) / ED Diagnoses   Final diagnoses:  Chronic right-sided low back pain with right-sided sciatica  Back muscle spasm  Eczema, unspecified type    ED Discharge Orders        Ordered    predniSONE (DELTASONE) 20 MG tablet     01/29/18 1044    triamcinolone cream (KENALOG) 0.1 %  2 times daily     01/29/18 1044    cyclobenzaprine (FLEXERIL) 10 MG tablet  3 times daily PRN     01/29/18 [address], [place], [place] 01/29/18 1052    Loren Racer, MD 01/30/18 [phone]

## 2018-07-21 IMAGING — US US EXTREM LOW VENOUS BILAT
1 series · 13 of 24 positions shown · non-contrast
Comparison: 07/02/2015

CLINICAL DATA: Bilateral lower extremity swelling and pain for 4
days.



[Series 1: us extrem low venous bilat · 0.05mm/px · 13 of 61 slices shown]
[im 1/61]
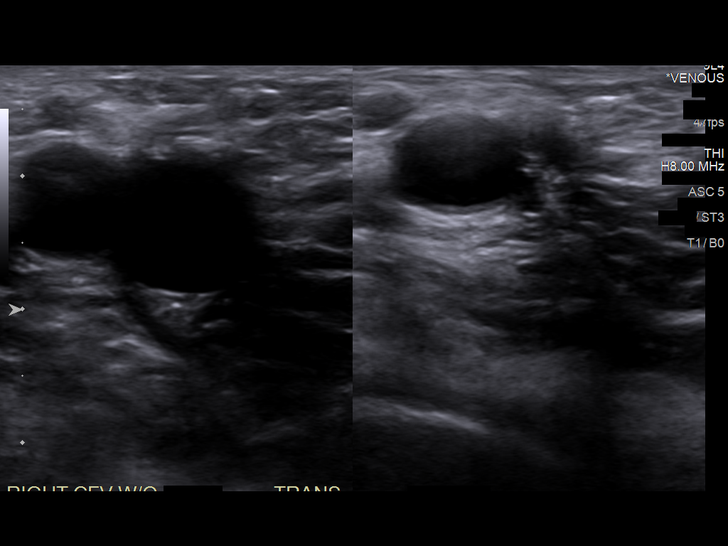
[im 6/61]
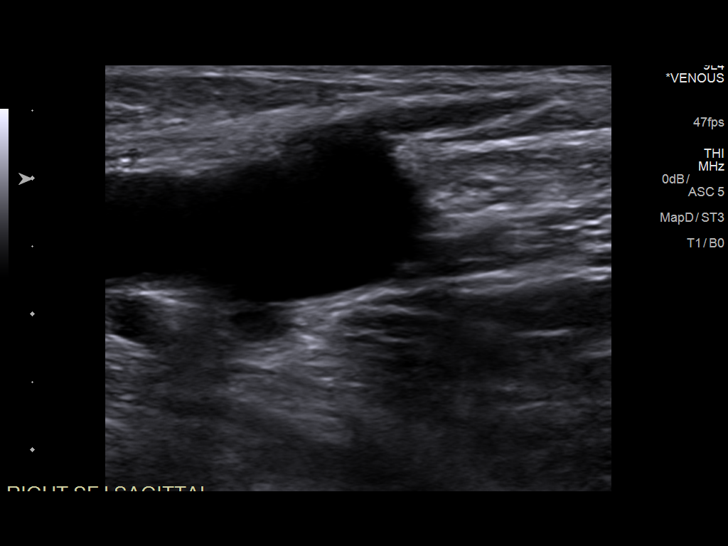
[im 11/61]
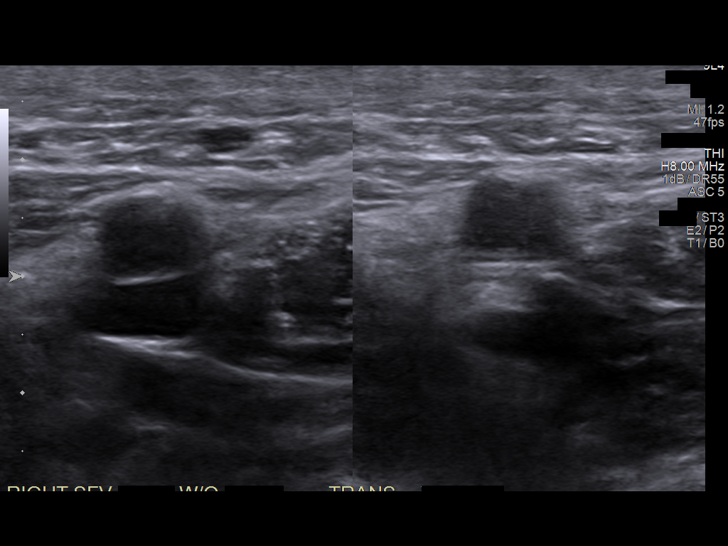
[im 16/61]
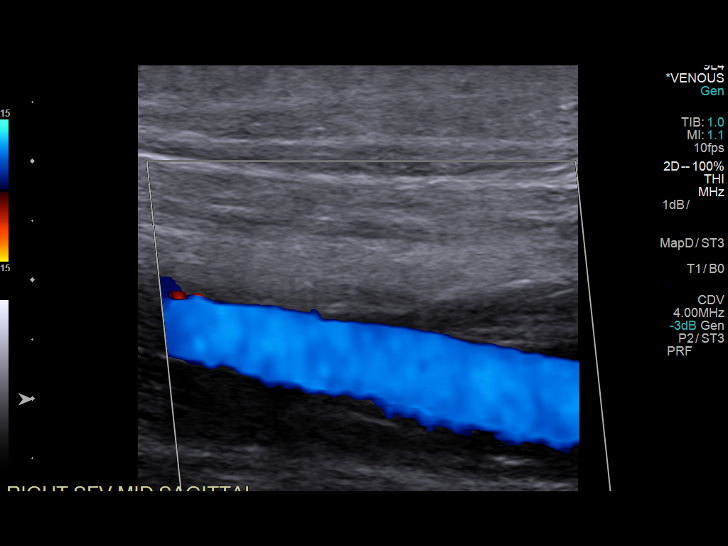
[im 21/61]
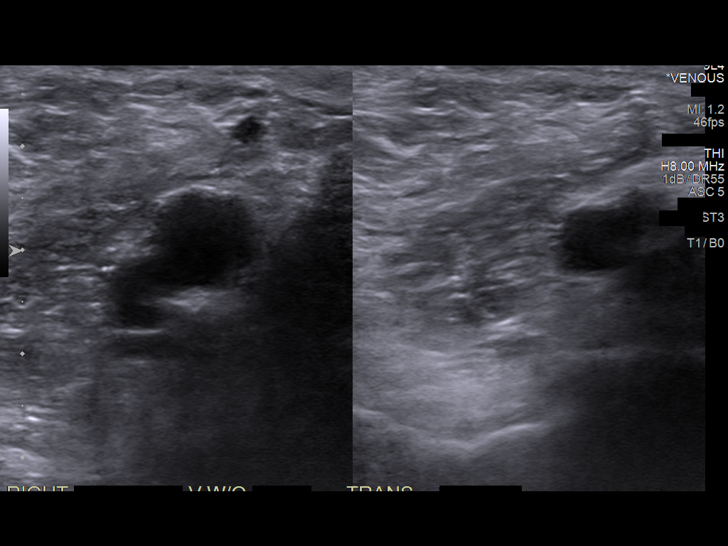
[im 27/61]
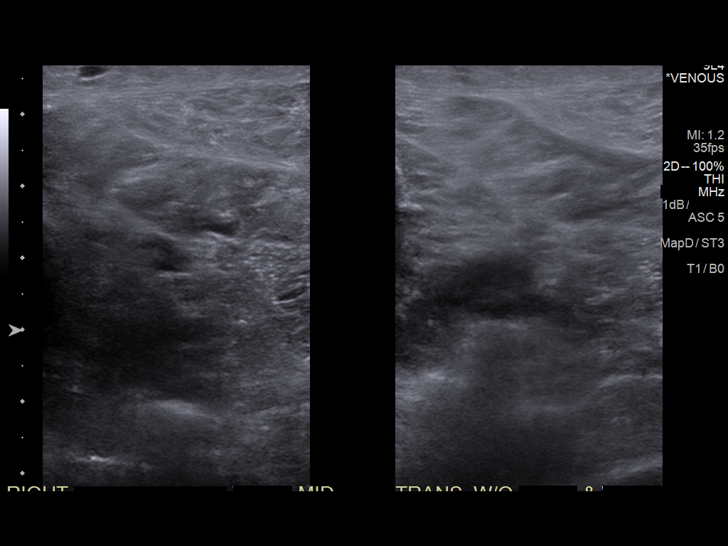
[im 32/61]
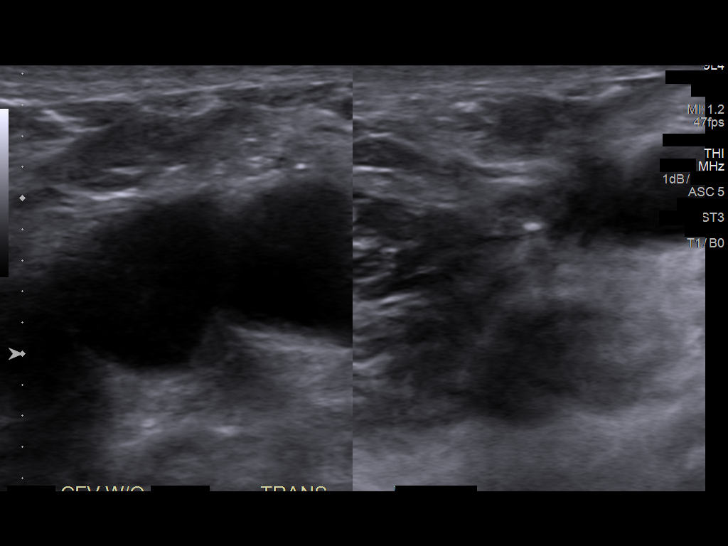
[im 34/61]
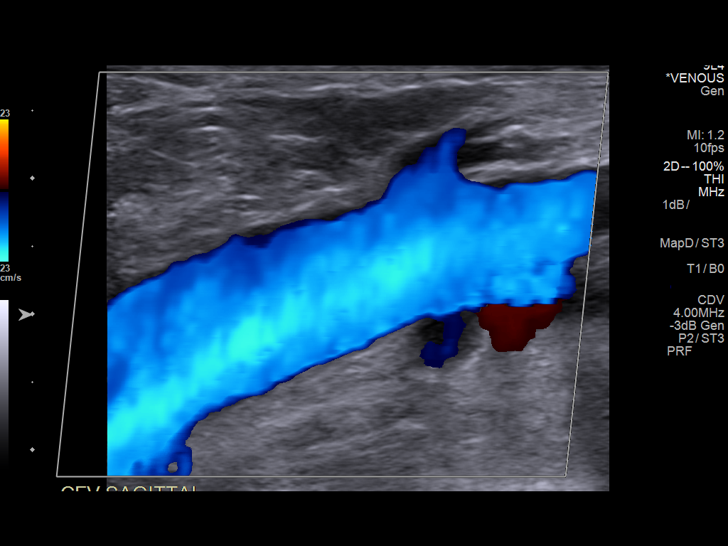
[im 40/61]
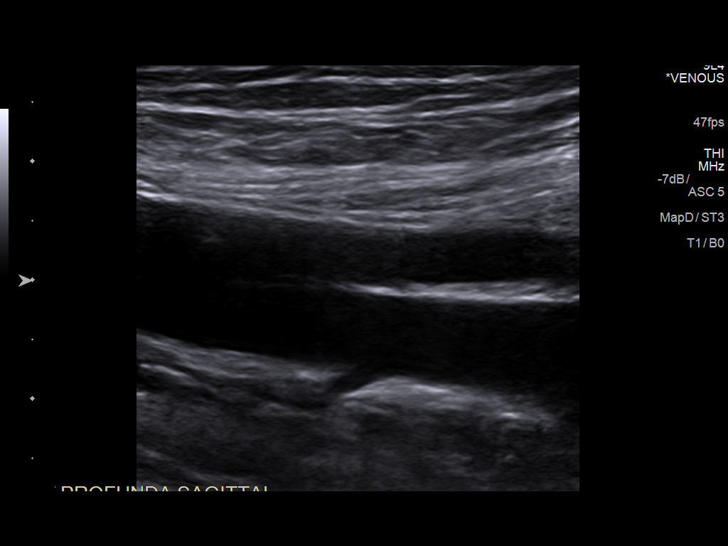
[im 45/61]
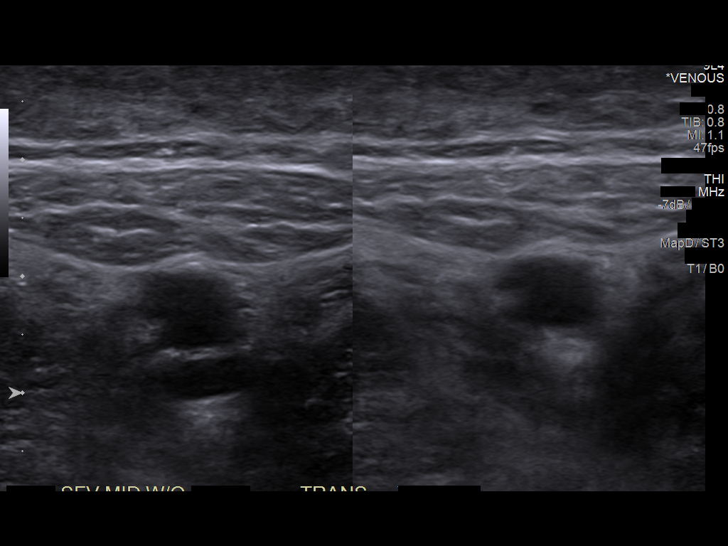
[im 50/61]
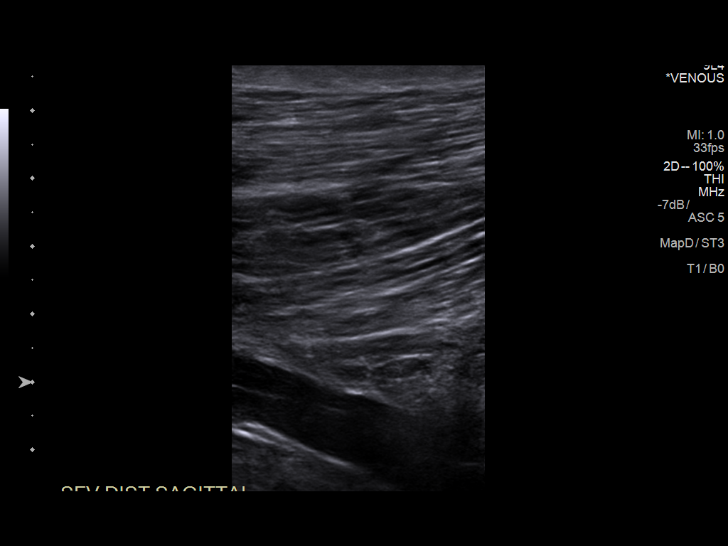
[im 55/61]
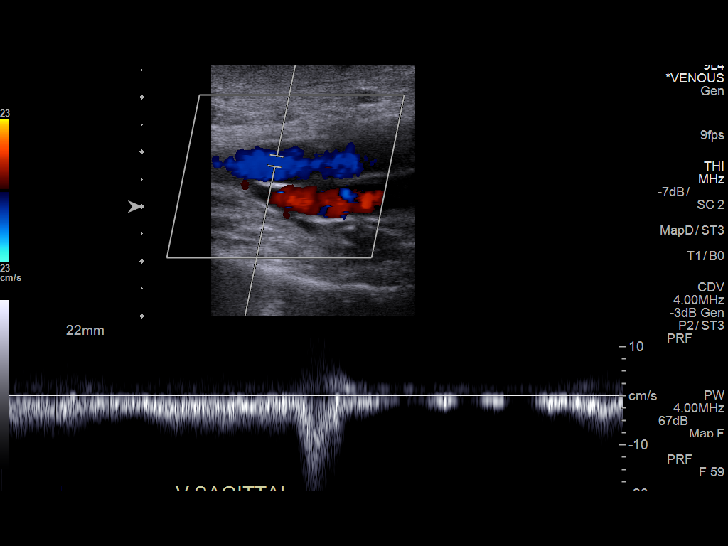
[im 61/61]
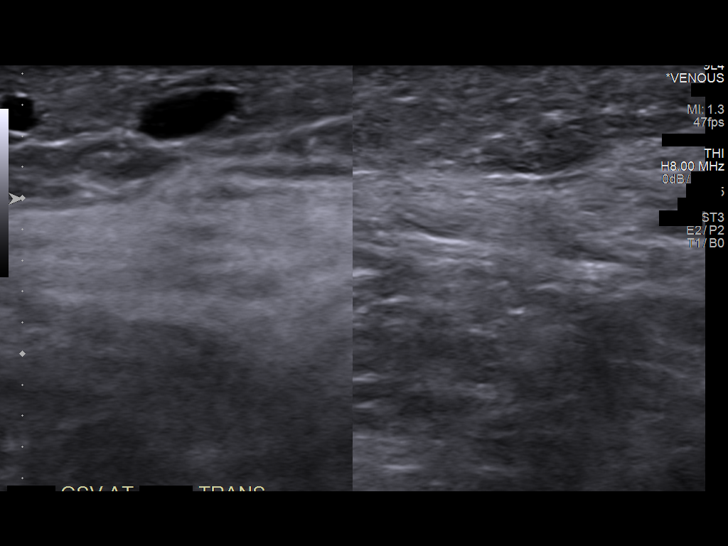

[13 of 24 positions shown; findings below may reference images not displayed]

FINDINGS: RIGHT LOWER EXTREMITY

Common Femoral Vein: No evidence of thrombus. Normal
compressibility, respiratory phasicity and response to augmentation.

Saphenofemoral Junction: No evidence of thrombus. Normal
compressibility and flow on color Doppler imaging.

Profunda Femoral Vein: No evidence of thrombus. Normal
compressibility and flow on color Doppler imaging.

Femoral Vein: No evidence of thrombus. Normal compressibility,
respiratory phasicity and response to augmentation.

Popliteal Vein: No evidence of thrombus. Normal compressibility,
respiratory phasicity and response to augmentation.

Calf Veins: No evidence of thrombus. Normal compressibility and flow
on color Doppler imaging.

Superficial Great Saphenous Vein: No evidence of thrombus. Normal
compressibility and flow on color Doppler imaging.

Venous Reflux:  None.

Other Findings:  None.

LEFT LOWER EXTREMITY

Common Femoral Vein: No evidence of thrombus. Normal
compressibility, respiratory phasicity and response to augmentation.

Saphenofemoral Junction: No evidence of thrombus. Normal
compressibility and flow on color Doppler imaging.

Profunda Femoral Vein: No evidence of thrombus. Normal
compressibility and flow on color Doppler imaging.

Femoral Vein: No evidence of thrombus. Normal compressibility,
respiratory phasicity and response to augmentation.

Popliteal Vein: No evidence of thrombus. Normal compressibility,
respiratory phasicity and response to augmentation.

Calf Veins: No evidence of thrombus. Normal compressibility and flow
on color Doppler imaging.

Superficial Great Saphenous Vein: No evidence of thrombus. Normal
compressibility and flow on color Doppler imaging.

Venous Reflux:  None.

Other Findings:  None.
IMPRESSION: No evidence of deep venous thrombosis.

## 2019-01-29 ENCOUNTER — Encounter (HOSPITAL_BASED_OUTPATIENT_CLINIC_OR_DEPARTMENT_OTHER): Payer: Self-pay | Admitting: Emergency Medicine

## 2019-01-29 ENCOUNTER — Other Ambulatory Visit: Payer: Self-pay

## 2019-01-29 ENCOUNTER — Emergency Department (HOSPITAL_BASED_OUTPATIENT_CLINIC_OR_DEPARTMENT_OTHER)
Admission: EM | Admit: 2019-01-29 | Discharge: 2019-01-29 | Disposition: A | Discharge status: Home / Self Care | Payer: Self-pay | Attending: Emergency Medicine | Admitting: Emergency Medicine

## 2019-01-29 DIAGNOSIS — F17210 Nicotine dependence, cigarettes, uncomplicated: Secondary | ICD-10-CM | POA: Insufficient documentation

## 2019-01-29 DIAGNOSIS — I10 Essential (primary) hypertension: Secondary | ICD-10-CM | POA: Insufficient documentation

## 2019-01-29 DIAGNOSIS — M5441 Lumbago with sciatica, right side: Secondary | ICD-10-CM | POA: Insufficient documentation

## 2019-01-29 MED ORDER — DEXAMETHASONE SODIUM PHOSPHATE 10 MG/ML IJ SOLN
10.0000 mg | Freq: Once | INTRAMUSCULAR | Status: AC
  Administered 2019-01-29: 10 mg via INTRAMUSCULAR
  Filled 2019-01-29: qty 1

## 2019-01-29 MED ORDER — IBUPROFEN 800 MG PO TABS: 800.0000 mg | ORAL_TABLET | Freq: Three times a day (TID) | ORAL | 0 refills | Status: DC | PRN

## 2019-01-29 MED ORDER — KETOROLAC TROMETHAMINE 30 MG/ML IJ SOLN
30.0000 mg | Freq: Once | INTRAMUSCULAR | Status: AC
  Administered 2019-01-29: 30 mg via INTRAMUSCULAR
  Filled 2019-01-29: qty 1

## 2019-01-29 MED ORDER — METHOCARBAMOL 500 MG PO TABS: 500.0000 mg | ORAL_TABLET | Freq: Three times a day (TID) | ORAL | 0 refills | Status: DC | PRN

## 2019-01-29 MED FILL — METHOCARBAMOL 500 MG TABLET: 500 | 6 days supply | Qty: 20 | Fill #0

## 2019-01-29 NOTE — Discharge Instructions (Signed)
You have been seen in the Emergency Department (ED)  today for back pain.  Your workup and exam have not shown any acute abnormalities and you are likely suffering from muscle strain or possible problems with your discs, but there is no treatment that will fix your symptoms at this time.  Please take Motrin (ibuprofen) as needed for your pain according to the instructions written on the box.  Alternatively, for the next five days you can take 800mg three times daily with meals (it may upset your stomach).   Please follow up with your doctor as soon as possible regarding today's ED visit and your back pain.  Return to the ED for worsening back pain, fever, weakness or numbness of either leg, or if you develop either (1) an inability to urinate or have bowel movements, or (2) loss of your ability to control your bathroom functions (if you start having "accidents"), or if you develop other new symptoms that concern you.  

## 2019-01-29 NOTE — ED Provider Notes (Signed)
Emergency Department Provider Note   I have reviewed the triage vital signs and the nursing notes.   HISTORY  Chief Complaint Back Pain   HPI Cory Reese is a 43 y.o. male with PMH of HTN and chronic back pain with history of spine injection presents to the emergency department for evaluation of midline back pain which radiates down both legs but worse on the right.  Patient describes the pain as sharp and typical of his lower back pain flare.  He follows with [place] [facility] for injections in various joints, including his lower back.  His last injection was in October 2019.  He denies any fevers, leg weakness, numbness.  No bowel or bladder incontinence.  No groin numbness.  He does not inject IV drugs.  Patient states he was hoping to have spine injections done today.  He also notes plan for outpatient MRI but states this has not been scheduled.  Pain is worse with movement.  He is tried over-the-counter pain medication with no relief.  Past Medical History:  Diagnosis Date  . Back pain   . Depression with anxiety   . Eczema   . Hypertension   . PTSD (post-traumatic stress disorder)   . Seizures [facility])     Patient Active Problem List   Diagnosis Date Noted  . Seizure (HCC)   . Leg weakness, bilateral 04/12/2016  . Seizures (HCC) 04/12/2016  . Hypokalemia 04/12/2016  . Hypomagnesemia 04/12/2016  . Hypophosphatemia 04/12/2016  . Alcohol abuse 04/12/2016  . Transaminitis 04/12/2016  . Anemia 04/12/2016  . Thrombocytopenia (HCC) 04/12/2016  . Bilateral leg weakness   . Delirium tremens (HCC) 04/09/2016  . History of eczema 04/08/2015  . Chronic back pain 10/28/2013    Past Surgical History:  Procedure Laterality Date  . KNEE SURGERY Left     Allergies Doxycycline  Family History  Problem Relation Age of Onset  . Hypertension Mother   . Hypertension Father   . Hyperlipidemia Neg Hx   . Heart attack Neg Hx   . Diabetes Neg Hx   . Sudden death Neg Hx      Social History Social History   Tobacco Use  . Smoking status: Current Every Day Smoker    Packs/day: 0.50    Types: Cigarettes  . Smokeless tobacco: Never Used  Substance Use Topics  . Alcohol use: Yes    Comment: occ  . Drug use: No    Review of Systems  Constitutional: No fever/chills Eyes: No visual changes. ENT: No sore throat. Cardiovascular: Denies chest pain. Respiratory: Denies shortness of breath. Gastrointestinal: No abdominal pain.  No nausea, no vomiting.  No diarrhea.  No constipation. Genitourinary: Negative for dysuria. Musculoskeletal: Positive for back pain. Skin: Negative for rash. Neurological: Negative for headaches, focal weakness or numbness.  10-point ROS otherwise negative.  ____________________________________________   PHYSICAL EXAM:  VITAL SIGNS: ED Triage Vitals  Enc Vitals Group     BP 01/29/19 0908 (!) 159/99     Pulse Rate 01/29/19 0908 82     Resp 01/29/19 0908 20     Temp 01/29/19 0908 98 F (36.7 C)     Temp Source 01/29/19 0908 Oral     SpO2 01/29/19 0908 100 %     Weight 01/29/19 0910 185 lb (83.9 kg)     Height 01/29/19 0910 6' 2.5" (1.892 m)     Pain Score 01/29/19 0909 10   Constitutional: Alert and oriented. Well appearing and in no acute  distress. Eyes: Conjunctivae are normal. Head: Atraumatic. Nose: No congestion/rhinnorhea. Mouth/Throat: Mucous membranes are moist.  Neck: No stridor.   Cardiovascular: Normal rate, regular rhythm. Good peripheral circulation. Grossly normal heart sounds.   Respiratory: Normal respiratory effort.  No retractions. Lungs CTAB. Gastrointestinal: Soft and nontender. No distention.  Musculoskeletal: No lower extremity tenderness nor edema. No gross deformities of extremities. Tenderness to palpation over the lower thoracic and lumbar spine both midline and paraspinal.  Neurologic:  Normal speech and language. No gross focal neurologic deficits are appreciated. 2+ patellar reflexes  bilaterally. Normal sensation. Patient ambulatory in the ED.  Skin:  Skin is warm, dry and intact. No rash noted.  ____________________________________________   PROCEDURES  Procedure(s) performed:   Procedures  None  ____________________________________________   INITIAL IMPRESSION / ASSESSMENT AND PLAN / ED COURSE  Pertinent labs & imaging results that were available during my care of the patient were reviewed by me and considered in my medical decision making (see chart for details).   Patient with history of chronic back pain presents to the emergency department with worsening pain and radiation down the right leg.  Symptoms seem consistent with sciatica.  He does not have red flag signs or symptoms to suspect acute spine emergency.  Patient's last spine injection was 6 months prior.  Doubt acute complication from this.  I do not feel he would benefit from imaging based on my exam.  I discussed that I am unable to perform the spine injection.  I provided the patient a shot of Toradol and Decadron.  He will follow-up with Dr. August Saucer who is managing his back pain.  Provided contact information at discharge along with discussion regarding emergency department return precautions.  This was provided in writing as well.    ____________________________________________  FINAL CLINICAL IMPRESSION(S) / ED DIAGNOSES  Final diagnoses:  Acute midline low back pain with right-sided sciatica     MEDICATIONS GIVEN DURING THIS VISIT:  Medications  ketorolac (TORADOL) 30 MG/ML injection 30 mg (30 mg Intramuscular Given 01/29/19 0940)  dexamethasone (DECADRON) injection 10 mg (10 mg Intramuscular Given 01/29/19 0940)     NEW OUTPATIENT MEDICATIONS STARTED DURING THIS VISIT:  New Prescriptions   IBUPROFEN (ADVIL,MOTRIN) 800 MG TABLET    Take 1 tablet (800 mg total) by mouth every 8 (eight) hours as needed for moderate pain.   METHOCARBAMOL (ROBAXIN) 500 MG TABLET    Take 1 tablet (500 mg total)  by mouth every 8 (eight) hours as needed for muscle spasms (back cramping).    Note:  This document was prepared using Dragon voice recognition software and may include unintentional dictation errors.  Alona Bene, MD Emergency Medicine    Long, Arlyss Repress, MD 01/29/19 1044

## 2019-01-29 NOTE — ED Triage Notes (Signed)
Pt has had back pain on right side and shoots down back of leg. Pain is 10.10 and sharp.

## 2019-07-21 ENCOUNTER — Encounter (HOSPITAL_BASED_OUTPATIENT_CLINIC_OR_DEPARTMENT_OTHER): Payer: Self-pay | Admitting: *Deleted

## 2019-07-21 ENCOUNTER — Other Ambulatory Visit: Payer: Self-pay

## 2019-07-21 ENCOUNTER — Emergency Department (HOSPITAL_BASED_OUTPATIENT_CLINIC_OR_DEPARTMENT_OTHER)
Admission: EM | Admit: 2019-07-21 | Discharge: 2019-07-21 | Disposition: A | Discharge status: Home / Self Care | Payer: Self-pay | Attending: Emergency Medicine | Admitting: Emergency Medicine

## 2019-07-21 DIAGNOSIS — F17210 Nicotine dependence, cigarettes, uncomplicated: Secondary | ICD-10-CM | POA: Insufficient documentation

## 2019-07-21 DIAGNOSIS — I10 Essential (primary) hypertension: Secondary | ICD-10-CM | POA: Insufficient documentation

## 2019-07-21 DIAGNOSIS — L309 Dermatitis, unspecified: Secondary | ICD-10-CM | POA: Insufficient documentation

## 2019-07-21 DIAGNOSIS — Z79899 Other long term (current) drug therapy: Secondary | ICD-10-CM | POA: Insufficient documentation

## 2019-07-21 MED ORDER — TRIAMCINOLONE ACETONIDE 0.1 % EX CREA: 1.0000 "application " | TOPICAL_CREAM | Freq: Two times a day (BID) | CUTANEOUS | 0 refills | Status: DC

## 2019-07-21 NOTE — ED Notes (Signed)
Pt. Uses a cream for his eczema and is out of the cream he uses

## 2019-07-21 NOTE — ED Provider Notes (Signed)
[facility] EMERGENCY DEPARTMENT Provider Note   CSN: [id number] Arrival date & time: 07/21/19  1245     History   Chief Complaint Chief Complaint  Patient presents with  . Wound Check    HPI Cory Reese is a 43 y.o. male past 1 history of depression, eczema, hypertension who presents for evaluation of areas of itching and weeping noted to his bilateral lower extremities.  He has a history of eczema and states that symptoms are consistent with his past episodes of eczema.  He uses triamcinolone cream for his eczema flares but states he recently ran out.  He states that he has noticed some weeping from the areas and states that they are very pruritic.  He has not noticed any overlying warmth, erythema.  He denies any fevers.     The history is provided by the patient.    Past Medical History:  Diagnosis Date  . Back pain   . Depression with anxiety   . Eczema   . Hypertension   . PTSD (post-traumatic stress disorder)   . Seizures [facility](HCC)     Patient Active Problem List   Diagnosis Date Noted  . Seizure (HCC)   . Leg weakness, bilateral 04/12/2016  . Seizures (HCC) 04/12/2016  . Hypokalemia 04/12/2016  . Hypomagnesemia 04/12/2016  . Hypophosphatemia 04/12/2016  . Alcohol abuse 04/12/2016  . Transaminitis 04/12/2016  . Anemia 04/12/2016  . Thrombocytopenia (HCC) 04/12/2016  . Bilateral leg weakness   . Delirium tremens (HCC) 04/09/2016  . History of eczema 04/08/2015  . Chronic back pain 10/28/2013    Past Surgical History:  Procedure Laterality Date  . KNEE SURGERY Left         Home Medications    Prior to Admission medications   Medication Sig Start Date End Date Taking? Authorizing Provider  cephALEXin (KEFLEX) 500 MG capsule Take 1 capsule (500 mg total) by mouth 4 (four) times daily. 07/09/16   Mackuen, Courteney Lyn, MD  clobetasol (TEMOVATE) 0.05 % external solution APPLY 1 APPLICATION TOPICALLY 2 TIMES DAILY. 06/09/16   Henrietta HooverBernhardt, Linda  C, NP  cloNIDine (CATAPRES) 0.1 MG tablet Take 1 tablet (0.1 mg total) by mouth 2 (two) times daily. 04/13/16   Rodolph Bonghompson, Daniel V, MD  cyclobenzaprine (FLEXERIL) 10 MG tablet Take 1 tablet (10 mg total) by mouth 3 (three) times daily as needed for muscle spasms. 01/29/18   Street, [place], PA-C  folic acid (FOLVITE) 1 MG tablet Take 1 tablet (1 mg total) by mouth daily. 04/13/16   Rodolph Bonghompson, Daniel V, MD  gabapentin (NEURONTIN) 100 MG capsule Take 100 mg by mouth 3 (three) times daily.    [provider]  HYDROcodone-acetaminophen (NORCO/VICODIN) 5-325 MG tablet Take 1 tablet by mouth every 6 (six) hours as needed for moderate pain. 04/13/16   Rodolph Bonghompson, Daniel V, MD  ibuprofen (ADVIL,MOTRIN) 800 MG tablet Take 1 tablet (800 mg total) by mouth every 8 (eight) hours as needed for moderate pain. 01/29/19   Long, Arlyss RepressJoshua G, MD  meloxicam (MOBIC) 15 MG tablet Take 1 tablet (15 mg total) by mouth daily. Patient not taking: Reported on 04/07/2016 12/10/15   Henrietta HooverBernhardt, Linda C, NP  methocarbamol (ROBAXIN) 500 MG tablet Take 1 tablet (500 mg total) by mouth every 8 (eight) hours as needed for muscle spasms (back cramping). 01/29/19   Long, Arlyss RepressJoshua G, MD  Multiple Vitamin (MULTIVITAMIN WITH MINERALS) TABS tablet Take 1 tablet by mouth daily.    [provider]  oxyCODONE-acetaminophen (PERCOCET/ROXICET)  5-325 MG tablet Take 1 tablet by mouth every 6 (six) hours as needed for severe pain. 07/09/16   Mackuen, Courteney Lyn, MD  pantoprazole (PROTONIX) 40 MG tablet Take 1 tablet (40 mg total) by mouth daily. Patient not taking: Reported on 05/22/2016 04/13/16   Rodolph Bong, MD  predniSONE (DELTASONE) 20 MG tablet Day 1 and 2: Take 3 tabs  Day 3-5: Take 2 tabs.  Day 5-8: take 1 tab 07/09/16   Mackuen, Courteney Lyn, MD  predniSONE (DELTASONE) 20 MG tablet 3 tabs po daily x 4 days 01/29/18   Street, [place], PA-C  senna-docusate (SENOKOT-S) 8.6-50 MG tablet Take 1 tablet by mouth at bedtime as needed for  mild constipation. Patient not taking: Reported on 05/22/2016 04/13/16   Rodolph Bong, MD  thiamine 100 MG tablet Take 1 tablet (100 mg total) by mouth daily. Patient not taking: Reported on 05/22/2016 04/13/16   Rodolph Bong, MD  triamcinolone cream (KENALOG) 0.1 % Apply 1 application topically 2 (two) times daily. 07/21/19   Maxwell Caul, PA-C    Family History Family History  Problem Relation Age of Onset  . Hypertension Mother   . Hypertension Father   . Hyperlipidemia Neg Hx   . Heart attack Neg Hx   . Diabetes Neg Hx   . Sudden death Neg Hx     Social History Social History   Tobacco Use  . Smoking status: Current Every Day Smoker    Packs/day: 0.50    Types: Cigarettes  . Smokeless tobacco: Never Used  Substance Use Topics  . Alcohol use: Yes    Comment: occ  . Drug use: No     Allergies   Doxycycline   Review of Systems Review of Systems  Constitutional: Negative for fever.  Skin: Positive for wound. Negative for color change.  All other systems reviewed and are negative.    Physical Exam Updated Vital Signs BP (!) 158/106 (BP Location: Right Arm)   Pulse 99   Temp 98.4 F (36.9 C) (Oral)   Resp 16   Ht 6\' 3"  (1.905 m)   Wt 76.2 kg   SpO2 100%   BMI 21.00 kg/m   Physical Exam Vitals signs and nursing note reviewed.  Constitutional:      Appearance: He is well-developed.  HENT:     Head: Normocephalic and atraumatic.  Eyes:     General: No scleral icterus.       Right eye: No discharge.        Left eye: No discharge.     Conjunctiva/sclera: Conjunctivae normal.  Cardiovascular:     Pulses:          Dorsalis pedis pulses are 2+ on the right side and 2+ on the left side.  Pulmonary:     Effort: Pulmonary effort is normal.  Skin:    General: Skin is warm and dry.          Comments: He has about a 4 x 5 cm area of dry, scaly skin noted to the mid tib-fib on the right lower extremity that is slightly oozing.  No active  bleeding.  No purulent drainage.  No overlying warmth, erythema.  3 cm area of scaly dry skin noted to the dorsal aspect of the left foot.  No overlying warmth, erythema.  Neurological:     Mental Status: He is alert.  Psychiatric:        Speech: Speech normal.  Behavior: Behavior normal.      ED Treatments / Results  Labs (all labs ordered are listed, but only abnormal results are displayed) Labs Reviewed - No data to display  EKG None  Radiology No results found.  Procedures Procedures (including critical care time)  Medications Ordered in ED Medications - No data to display   Initial Impression / Assessment and Plan / ED Course  I have reviewed the triage vital signs and the nursing notes.  Pertinent labs & imaging results that were available during my care of the patient were reviewed by me and considered in my medical decision making (see chart for details).        42 year old male who presents for evaluation of areas of dry scaly skin that is weeping.  History of eczema and states that symptoms feel consistent with previous.  No overlying warmth, erythema.  No fevers.  Uses triamcinolone cream but is out. Patient is afebrile, non-toxic appearing, sitting comfortably on examination table. Vital signs reviewed and stable.  On exam, he has areas of dry scaly skin.  No overlying warmth, erythema.  No purulent drainage.  Suspect this is his eczema.  History/physical exam not concerning for cellulitis.  No evidence that he needs antibiotic therapy.  We will plan to give triamcinolone cream.  Instructed patient to follow-up with primary care doctor for further evaluation. At this time, patient exhibits no emergent life-threatening condition that require further evaluation in ED or admission. Patient had ample opportunity for questions and discussion. All patient's questions were answered with full understanding. Strict return precautions discussed. Patient expresses  understanding and agreement to plan.   Portions of this note were generated with [employer]. Dictation errors may occur despite best attempts at proofreading.  Final Clinical Impressions(s) / ED Diagnoses   Final diagnoses:  Eczema, unspecified type    ED Discharge Orders         Ordered    triamcinolone cream (KENALOG) 0.1 %  2 times daily     07/21/19 1457           Volanda Napoleon, PA-C 07/21/19 1603    Fredia Sorrow, MD 07/27/19 [phone]

## 2019-07-21 NOTE — Discharge Instructions (Signed)
Use triamcinolone cream on the areas that are more problematic.  As we discussed, I have provided a referral to [facility] for primary care follow-up.  Return the emergency department for any fevers, worsening drainage, redness or swelling around the areas or any other worsening concerning symptoms.

## 2019-07-21 NOTE — ED Triage Notes (Signed)
Drainage from his right lower leg. States he has eczema and has been scratching his skin. He ran out of "cream" a while ago.

## 2019-09-24 ENCOUNTER — Other Ambulatory Visit: Payer: Self-pay

## 2019-09-24 ENCOUNTER — Emergency Department (HOSPITAL_BASED_OUTPATIENT_CLINIC_OR_DEPARTMENT_OTHER)
Admission: EM | Admit: 2019-09-24 | Discharge: 2019-09-24 | Disposition: A | Discharge status: Home / Self Care | Payer: Self-pay | Attending: Emergency Medicine | Admitting: Emergency Medicine

## 2019-09-24 ENCOUNTER — Encounter (HOSPITAL_BASED_OUTPATIENT_CLINIC_OR_DEPARTMENT_OTHER): Payer: Self-pay

## 2019-09-24 DIAGNOSIS — Z79899 Other long term (current) drug therapy: Secondary | ICD-10-CM | POA: Insufficient documentation

## 2019-09-24 DIAGNOSIS — U071 COVID-19: Secondary | ICD-10-CM | POA: Insufficient documentation

## 2019-09-24 DIAGNOSIS — I10 Essential (primary) hypertension: Secondary | ICD-10-CM | POA: Insufficient documentation

## 2019-09-24 LAB — SARS CORONAVIRUS 2 AG (30 MIN TAT): SARS Coronavirus 2 Ag: POSITIVE — AB

## 2019-09-24 NOTE — Discharge Instructions (Addendum)
You were seen in the emergency department for cough and concern for possible Covid.  Your Covid test here was positive.  You will need to isolate for 14 days.  Please keep well-hydrated and use Tylenol as needed for fever and body aches.  Return to the emergency department if any worsening or concerning symptoms.

## 2019-09-24 NOTE — ED Triage Notes (Signed)
Pt states that he has been having a cough with some congestion and a runny nose reports that 2 members of his home have tested positive and he wants to be tested today.

## 2019-09-24 NOTE — ED Provider Notes (Signed)
[facility] EMERGENCY DEPARTMENT Provider Note   CSN: [id number] Arrival date & time: 09/24/19  [id number]     History   Chief Complaint Chief Complaint  Patient presents with  . Nasal Congestion    HPI Cory Reese is a 43 y.o. male.  He had multiple family members test positive for Covid yesterday.  He is complaining of 2 days of runny nose sneezing and cough productive of some white sputum.  He does not think he has had a fever.  No nausea vomiting diarrhea.  No shortness of breath.     The history is provided by the patient.  Cough Cough characteristics:  Productive Sputum characteristics:  White Severity:  Moderate Onset quality:  Gradual Duration:  2 days Timing:  Intermittent Progression:  Unchanged Chronicity:  New Smoker: yes   Context: sick contacts   Relieved by:  None tried Worsened by:  Nothing Ineffective treatments:  None tried Associated symptoms: rhinorrhea and sore throat   Associated symptoms: no chest pain, no ear pain, no fever, no headaches, no myalgias, no rash and no shortness of breath   Risk factors: no recent travel     Past Medical History:  Diagnosis Date  . Back pain   . Depression with anxiety   . Eczema   . Hypertension   . PTSD (post-traumatic stress disorder)   . Seizures [facility](HCC)     Patient Active Problem List   Diagnosis Date Noted  . Seizure (HCC)   . Leg weakness, bilateral 04/12/2016  . Seizures (HCC) 04/12/2016  . Hypokalemia 04/12/2016  . Hypomagnesemia 04/12/2016  . Hypophosphatemia 04/12/2016  . Alcohol abuse 04/12/2016  . Transaminitis 04/12/2016  . Anemia 04/12/2016  . Thrombocytopenia (HCC) 04/12/2016  . Bilateral leg weakness   . Delirium tremens (HCC) 04/09/2016  . History of eczema 04/08/2015  . Chronic back pain 10/28/2013    Past Surgical History:  Procedure Laterality Date  . KNEE SURGERY Left         Home Medications    Prior to Admission medications   Medication Sig Start Date  End Date Taking? Authorizing Provider  cephALEXin (KEFLEX) 500 MG capsule Take 1 capsule (500 mg total) by mouth 4 (four) times daily. 07/09/16   Mackuen, Courteney Lyn, MD  clobetasol (TEMOVATE) 0.05 % external solution APPLY 1 APPLICATION TOPICALLY 2 TIMES DAILY. 06/09/16   Henrietta HooverBernhardt, Linda C, NP  cloNIDine (CATAPRES) 0.1 MG tablet Take 1 tablet (0.1 mg total) by mouth 2 (two) times daily. 04/13/16   Rodolph Bonghompson, Daniel V, MD  cyclobenzaprine (FLEXERIL) 10 MG tablet Take 1 tablet (10 mg total) by mouth 3 (three) times daily as needed for muscle spasms. 01/29/18   Street, [place], PA-C  folic acid (FOLVITE) 1 MG tablet Take 1 tablet (1 mg total) by mouth daily. 04/13/16   Rodolph Bonghompson, Daniel V, MD  gabapentin (NEURONTIN) 100 MG capsule Take 100 mg by mouth 3 (three) times daily.    [provider]  HYDROcodone-acetaminophen (NORCO/VICODIN) 5-325 MG tablet Take 1 tablet by mouth every 6 (six) hours as needed for moderate pain. 04/13/16   Rodolph Bonghompson, Daniel V, MD  ibuprofen (ADVIL,MOTRIN) 800 MG tablet Take 1 tablet (800 mg total) by mouth every 8 (eight) hours as needed for moderate pain. 01/29/19   Long, Arlyss RepressJoshua G, MD  meloxicam (MOBIC) 15 MG tablet Take 1 tablet (15 mg total) by mouth daily. Patient not taking: Reported on 04/07/2016 12/10/15   Henrietta HooverBernhardt, Linda C, NP  methocarbamol (ROBAXIN) 500 MG tablet  Take 1 tablet (500 mg total) by mouth every 8 (eight) hours as needed for muscle spasms (back cramping). 01/29/19   Long, Wonda Olds, MD  Multiple Vitamin (MULTIVITAMIN WITH MINERALS) TABS tablet Take 1 tablet by mouth daily.    [provider]  oxyCODONE-acetaminophen (PERCOCET/ROXICET) 5-325 MG tablet Take 1 tablet by mouth every 6 (six) hours as needed for severe pain. 07/09/16   Mackuen, Courteney Lyn, MD  pantoprazole (PROTONIX) 40 MG tablet Take 1 tablet (40 mg total) by mouth daily. Patient not taking: Reported on 05/22/2016 04/13/16   Eugenie Filler, MD  predniSONE (DELTASONE) 20 MG tablet Day  1 and 2: Take 3 tabs  Day 3-5: Take 2 tabs.  Day 5-8: take 1 tab 07/09/16   Mackuen, Courteney Lyn, MD  predniSONE (DELTASONE) 20 MG tablet 3 tabs po daily x 4 days 01/29/18   Street, [place], PA-C  senna-docusate (SENOKOT-S) 8.6-50 MG tablet Take 1 tablet by mouth at bedtime as needed for mild constipation. Patient not taking: Reported on 05/22/2016 04/13/16   Eugenie Filler, MD  thiamine 100 MG tablet Take 1 tablet (100 mg total) by mouth daily. Patient not taking: Reported on 05/22/2016 04/13/16   Eugenie Filler, MD  triamcinolone cream (KENALOG) 0.1 % Apply 1 application topically 2 (two) times daily. 07/21/19   Volanda Napoleon, PA-C    Family History Family History  Problem Relation Age of Onset  . Hypertension Mother   . Hypertension Father   . Hyperlipidemia Neg Hx   . Heart attack Neg Hx   . Diabetes Neg Hx   . Sudden death Neg Hx     Social History Social History   Tobacco Use  . Smoking status: Current Every Day Smoker    Packs/day: 0.50    Types: Cigarettes  . Smokeless tobacco: Never Used  Substance Use Topics  . Alcohol use: Yes    Comment: occ  . Drug use: No     Allergies   Doxycycline   Review of Systems Review of Systems  Constitutional: Negative for fever.  HENT: Positive for rhinorrhea and sore throat. Negative for ear pain.   Eyes: Negative for visual disturbance.  Respiratory: Positive for cough. Negative for shortness of breath.   Cardiovascular: Negative for chest pain.  Gastrointestinal: Negative for abdominal pain.  Genitourinary: Negative for dysuria.  Musculoskeletal: Negative for myalgias.  Skin: Negative for rash.  Neurological: Negative for headaches.     Physical Exam Updated Vital Signs BP (!) 153/99 (BP Location: Right Arm)   Pulse 100   Temp 97.8 F (36.6 C) (Oral)   Resp 16   Ht 6\' 3"  (1.905 m)   Wt 83.9 kg   SpO2 99%   BMI 23.12 kg/m   Physical Exam Vitals signs and nursing note reviewed.  Constitutional:       Appearance: He is well-developed.  HENT:     Head: Normocephalic and atraumatic.     Mouth/Throat:     Mouth: Mucous membranes are moist.     Pharynx: Oropharynx is clear.  Eyes:     Conjunctiva/sclera: Conjunctivae normal.  Neck:     Musculoskeletal: Neck supple.  Cardiovascular:     Rate and Rhythm: Normal rate and regular rhythm.     Pulses: Normal pulses.     Heart sounds: No murmur.  Pulmonary:     Effort: Pulmonary effort is normal. No respiratory distress.     Breath sounds: Normal breath sounds.  Abdominal:  Palpations: Abdomen is soft.     Tenderness: There is no abdominal tenderness.  Musculoskeletal: Normal range of motion.     Right lower leg: No edema.     Left lower leg: No edema.  Skin:    General: Skin is warm and dry.     Capillary Refill: Capillary refill takes less than 2 seconds.  Neurological:     General: No focal deficit present.     Mental Status: He is alert.      ED Treatments / Results  Labs (all labs ordered are listed, but only abnormal results are displayed) Labs Reviewed  SARS CORONAVIRUS 2 AG (30 MIN TAT) - Abnormal; Notable for the following components:      Result Value   SARS Coronavirus 2 Ag POSITIVE (*)    All other components within normal limits    EKG None  Radiology No results found.  Procedures Procedures (including critical care time)  Medications Ordered in ED Medications - No data to display   Initial Impression / Assessment and Plan / ED Course  I have reviewed the triage vital signs and the nursing notes.  Pertinent labs & imaging results that were available during my care of the patient were reviewed by me and considered in my medical decision making (see chart for details).  Clinical Course as of Sep 23 1038  Wed Sep 24, 2019  [id number] 43 year old smoker here with mild cough in the setting of having multiple Covid positive contacts at home.  Differential diagnosis includes Covid, bronchitis, pneumonia,  viral syndrome.  Afebrile here satting 99% lungs clear on auscultation.  Getting a rapid Covid test and repeat with Tier 4 test if negative.   [MB]    Clinical Course User Index [MB] Terrilee Files, MD   Cory Moors was evaluated in Emergency Department on 09/24/2019 for the symptoms described in the history of present illness. He was evaluated in the context of the global COVID-19 pandemic, which necessitated consideration that the patient might be at risk for infection with the SARS-CoV-2 virus that causes COVID-19. Institutional protocols and algorithms that pertain to the evaluation of patients at risk for COVID-19 are in a state of rapid change based on information released by regulatory bodies including the CDC and federal and state organizations. These policies and algorithms were followed during the patient's care in the ED.      Final Clinical Impressions(s) / ED Diagnoses   Final diagnoses:  COVID-19 virus infection    ED Discharge Orders    None       Terrilee Files, MD 09/24/19 1040

## 2019-10-10 ENCOUNTER — Other Ambulatory Visit: Payer: Self-pay

## 2019-10-10 ENCOUNTER — Ambulatory Visit (HOSPITAL_BASED_OUTPATIENT_CLINIC_OR_DEPARTMENT_OTHER): Admission: RE | Admit: 2019-10-10 | Payer: Self-pay | Source: Ambulatory Visit

## 2019-10-10 ENCOUNTER — Encounter (HOSPITAL_BASED_OUTPATIENT_CLINIC_OR_DEPARTMENT_OTHER): Payer: Self-pay | Admitting: Emergency Medicine

## 2019-10-10 ENCOUNTER — Emergency Department (HOSPITAL_BASED_OUTPATIENT_CLINIC_OR_DEPARTMENT_OTHER)
Admission: EM | Admit: 2019-10-10 | Discharge: 2019-10-10 | Disposition: A | Discharge status: Home / Self Care | Payer: Self-pay | Attending: Emergency Medicine | Admitting: Emergency Medicine

## 2019-10-10 DIAGNOSIS — U071 COVID-19: Secondary | ICD-10-CM

## 2019-10-10 DIAGNOSIS — Z5321 Procedure and treatment not carried out due to patient leaving prior to being seen by health care provider: Secondary | ICD-10-CM | POA: Insufficient documentation

## 2019-10-10 DIAGNOSIS — R42 Dizziness and giddiness: Secondary | ICD-10-CM | POA: Insufficient documentation

## 2019-10-10 DIAGNOSIS — R5383 Other fatigue: Secondary | ICD-10-CM | POA: Insufficient documentation

## 2019-10-10 NOTE — ED Triage Notes (Addendum)
Came off quarantine on wed and went back to work  On wed , now feels tied and lightheaded and dizzy has a h/a.  States not taking any  Of his  Of his bp meds  Or others  For that matter

## 2019-10-10 NOTE — ED Notes (Signed)
Pt states he doesn't want to have anything done when PA  Went into see pt , pt refused lab draw and xray states wants to leave

## 2020-01-05 ENCOUNTER — Encounter (HOSPITAL_BASED_OUTPATIENT_CLINIC_OR_DEPARTMENT_OTHER): Payer: Self-pay | Admitting: *Deleted

## 2020-01-05 ENCOUNTER — Emergency Department (HOSPITAL_BASED_OUTPATIENT_CLINIC_OR_DEPARTMENT_OTHER)
Admission: EM | Admit: 2020-01-05 | Discharge: 2020-01-05 | Disposition: A | Discharge status: Home / Self Care | Payer: Self-pay | Attending: Emergency Medicine | Admitting: Emergency Medicine

## 2020-01-05 ENCOUNTER — Other Ambulatory Visit: Payer: Self-pay

## 2020-01-05 DIAGNOSIS — G40909 Epilepsy, unspecified, not intractable, without status epilepticus: Secondary | ICD-10-CM | POA: Insufficient documentation

## 2020-01-05 DIAGNOSIS — L989 Disorder of the skin and subcutaneous tissue, unspecified: Secondary | ICD-10-CM | POA: Insufficient documentation

## 2020-01-05 DIAGNOSIS — F17210 Nicotine dependence, cigarettes, uncomplicated: Secondary | ICD-10-CM | POA: Insufficient documentation

## 2020-01-05 DIAGNOSIS — Z881 Allergy status to other antibiotic agents status: Secondary | ICD-10-CM | POA: Insufficient documentation

## 2020-01-05 DIAGNOSIS — Z79899 Other long term (current) drug therapy: Secondary | ICD-10-CM | POA: Insufficient documentation

## 2020-01-05 DIAGNOSIS — I10 Essential (primary) hypertension: Secondary | ICD-10-CM | POA: Insufficient documentation

## 2020-01-05 DIAGNOSIS — R238 Other skin changes: Secondary | ICD-10-CM

## 2020-01-05 MED ORDER — TRIAMCINOLONE ACETONIDE 0.1 % EX CREA: 1.0000 "application " | TOPICAL_CREAM | Freq: Two times a day (BID) | CUTANEOUS | 0 refills | Status: DC

## 2020-01-05 MED ORDER — METHYLPREDNISOLONE 4 MG PO TBPK: ORAL_TABLET | ORAL | 0 refills | Status: DC

## 2020-01-05 MED FILL — METHYLPREDNISOLONE 4 MG TBP: 4 | 6 days supply | Qty: 21 | Fill #0

## 2020-01-05 MED FILL — TRIAMCINOLONE 0.1% CREAM: 0.1 | 15 days supply | Qty: 15 | Fill #0

## 2020-01-05 NOTE — Discharge Instructions (Addendum)
Take the prescription as prescribed.  Please use sparingly the cream.  Please follow-up with a dermatologist.  Return to the ER if you have any infectious symptoms.

## 2020-01-05 NOTE — ED Provider Notes (Signed)
[facility] EMERGENCY DEPARTMENT Provider Note   CSN: [id number] Arrival date & time: 01/05/20  1400     History Chief Complaint  Patient presents with  . Leg Pain    Cory Reese is a 44 y.o. male.  HPI 44 year old African-American male with a past medical history significant for hypertension, eczema presents to the emergency department today for evaluation of eczema flare.  Patient reports for the past several days she has had worsening of his eczema on his legs, feet, back.  States that it is painful to walk at times.  Patient denies any leaking of fluid.  Denies any fevers or chills.  No nausea or vomiting.  No erythema.  Patient states that prednisone and triamcinolone cream usually helps.  He reports that since Covid he has not been on follow-up with his dermatologist.  He reports worsening symptoms over the past year.  Reports the area is very pruritic and painful.    Past Medical History:  Diagnosis Date  . Back pain   . Depression with anxiety   . Eczema   . Hypertension   . PTSD (post-traumatic stress disorder)   . Seizures [facility])     Patient Active Problem List   Diagnosis Date Noted  . Seizure (HCC)   . Leg weakness, bilateral 04/12/2016  . Seizures (HCC) 04/12/2016  . Hypokalemia 04/12/2016  . Hypomagnesemia 04/12/2016  . Hypophosphatemia 04/12/2016  . Alcohol abuse 04/12/2016  . Transaminitis 04/12/2016  . Anemia 04/12/2016  . Thrombocytopenia (HCC) 04/12/2016  . Bilateral leg weakness   . Delirium tremens (HCC) 04/09/2016  . History of eczema 04/08/2015  . Chronic back pain 10/28/2013    Past Surgical History:  Procedure Laterality Date  . KNEE SURGERY Left        Family History  Problem Relation Age of Onset  . Hypertension Mother   . Hypertension Father   . Hyperlipidemia Neg Hx   . Heart attack Neg Hx   . Diabetes Neg Hx   . Sudden death Neg Hx     Social History   Tobacco Use  . Smoking status: Current Every Day Smoker      Packs/day: 0.50    Types: Cigarettes  . Smokeless tobacco: Never Used  Substance Use Topics  . Alcohol use: Yes    Comment: occ  . Drug use: No    Home Medications Prior to Admission medications   Medication Sig Start Date End Date Taking? Authorizing Provider  cephALEXin (KEFLEX) 500 MG capsule Take 1 capsule (500 mg total) by mouth 4 (four) times daily. 07/09/16   Mackuen, Courteney Lyn, MD  clobetasol (TEMOVATE) 0.05 % external solution APPLY 1 APPLICATION TOPICALLY 2 TIMES DAILY. 06/09/16   Henrietta Hoover, NP  cloNIDine (CATAPRES) 0.1 MG tablet Take 1 tablet (0.1 mg total) by mouth 2 (two) times daily. 04/13/16   Rodolph Bong, MD  cyclobenzaprine (FLEXERIL) 10 MG tablet Take 1 tablet (10 mg total) by mouth 3 (three) times daily as needed for muscle spasms. 01/29/18   Street, [place], PA-C  folic acid (FOLVITE) 1 MG tablet Take 1 tablet (1 mg total) by mouth daily. 04/13/16   Rodolph Bong, MD  gabapentin (NEURONTIN) 100 MG capsule Take 100 mg by mouth 3 (three) times daily.    [provider]  HYDROcodone-acetaminophen (NORCO/VICODIN) 5-325 MG tablet Take 1 tablet by mouth every 6 (six) hours as needed for moderate pain. 04/13/16   Rodolph Bong, MD  ibuprofen (ADVIL,MOTRIN) 800  MG tablet Take 1 tablet (800 mg total) by mouth every 8 (eight) hours as needed for moderate pain. 01/29/19   Long, Arlyss Repress, MD  meloxicam (MOBIC) 15 MG tablet Take 1 tablet (15 mg total) by mouth daily. Patient not taking: Reported on 04/07/2016 12/10/15   Henrietta Hoover, NP  methocarbamol (ROBAXIN) 500 MG tablet Take 1 tablet (500 mg total) by mouth every 8 (eight) hours as needed for muscle spasms (back cramping). 01/29/19   LongArlyss Repress, MD  methylPREDNISolone (MEDROL DOSEPAK) 4 MG TBPK tablet Take as prescribed 01/05/20   Demetrios Loll T, PA-C  Multiple Vitamin (MULTIVITAMIN WITH MINERALS) TABS tablet Take 1 tablet by mouth daily.    [provider]   oxyCODONE-acetaminophen (PERCOCET/ROXICET) 5-325 MG tablet Take 1 tablet by mouth every 6 (six) hours as needed for severe pain. 07/09/16   Mackuen, Courteney Lyn, MD  pantoprazole (PROTONIX) 40 MG tablet Take 1 tablet (40 mg total) by mouth daily. Patient not taking: Reported on 05/22/2016 04/13/16   Rodolph Bong, MD  predniSONE (DELTASONE) 20 MG tablet Day 1 and 2: Take 3 tabs  Day 3-5: Take 2 tabs.  Day 5-8: take 1 tab 07/09/16   Mackuen, Courteney Lyn, MD  predniSONE (DELTASONE) 20 MG tablet 3 tabs po daily x 4 days 01/29/18   Street, [place], PA-C  senna-docusate (SENOKOT-S) 8.6-50 MG tablet Take 1 tablet by mouth at bedtime as needed for mild constipation. Patient not taking: Reported on 05/22/2016 04/13/16   Rodolph Bong, MD  thiamine 100 MG tablet Take 1 tablet (100 mg total) by mouth daily. Patient not taking: Reported on 05/22/2016 04/13/16   Rodolph Bong, MD  triamcinolone cream (KENALOG) 0.1 % Apply 1 application topically 2 (two) times daily. 01/05/20   Rise Mu, PA-C    Allergies    Doxycycline  Review of Systems   Review of Systems  Constitutional: Negative for chills and fever.  Eyes: Negative for discharge.  Respiratory: Negative for cough.   Gastrointestinal: Negative for nausea and vomiting.  Musculoskeletal: Positive for myalgias.  Skin: Positive for rash and wound.  Neurological: Negative for weakness and numbness.  Psychiatric/Behavioral: Negative for confusion.    Physical Exam Updated Vital Signs BP (!) 147/100   Pulse (!) 115   Temp 98.1 F (36.7 C) (Oral)   Resp 20   Ht 6\' 3"  (1.905 m)   Wt 79.1 kg   SpO2 100%   BMI 21.80 kg/m   Physical Exam Vitals and nursing note reviewed.  Constitutional:      General: He is not in acute distress.    Appearance: He is well-developed. He is not ill-appearing or toxic-appearing.  HENT:     Head: Normocephalic and atraumatic.  Eyes:     General: No scleral icterus.       Right eye: No  discharge.        Left eye: No discharge.  Cardiovascular:     Pulses: Normal pulses.  Pulmonary:     Effort: No respiratory distress.  Musculoskeletal:        General: Normal range of motion.     Cervical back: Normal range of motion.  Skin:    General: Skin is warm and dry.     Capillary Refill: Capillary refill takes less than 2 seconds.     Coloration: Skin is not pale.     Comments: Patient with dry scaly plaques noted to the bilateral lower extremities, dorsum of feet and up  the lower back.  There is no warmth or erythema.  There is no drainage from the area.  Neurological:     Mental Status: He is alert.  Psychiatric:        Behavior: Behavior normal.        Thought Content: Thought content normal.        Judgment: Judgment normal.     ED Results / Procedures / Treatments   Labs (all labs ordered are listed, but only abnormal results are displayed) Labs Reviewed - No data to display  EKG None  Radiology No results found.  Procedures Procedures (including critical care time)  Medications Ordered in ED Medications - No data to display  ED Course  I have reviewed the triage vital signs and the nursing notes.  Pertinent labs & imaging results that were available during my care of the patient were reviewed by me and considered in my medical decision making (see chart for details).    MDM Rules/Calculators/A&P                      44 year old male presents the ER for concern for eczema flare.  Patient states that it is very painful and pruritic.  Patient denies any infectious symptoms.  Vital signs are overall reassuring.  Patient mildly tachycardic.  Patient is very anxious about his weight in the ER and is ready to be discharged.  He just wants a prescription for triamcinolone cream and prednisone.  States he is going to call his dermatologist for follow-up.  Patient has no signs of systemic infection.  Discussed that if his symptoms worsen he should return to the  ER.  Pt is hemodynamically stable, in NAD, & able to ambulate in the ED. Evaluation does not show pathology that would require ongoing emergent intervention or inpatient treatment. I explained the diagnosis to the patient. Pain has been managed & has no complaints prior to dc. Pt is comfortable with above plan and is stable for discharge at this time. All questions were answered prior to disposition. Strict return precautions for f/u to the ED were discussed. Encouraged follow up with PCP.  Final Clinical Impression(s) / ED Diagnoses Final diagnoses:  Skin irritation    Rx / DC Orders ED Discharge Orders         Ordered    triamcinolone cream (KENALOG) 0.1 %  2 times daily     01/05/20 1604    methylPREDNISolone (MEDROL DOSEPAK) 4 MG TBPK tablet     01/05/20 1604           Doristine Devoid, PA-C 01/05/20 1616    Sherwood Gambler, MD 01/08/20 1655

## 2020-01-05 NOTE — ED Triage Notes (Signed)
Eczema. Painful.

## 2020-01-05 NOTE — ED Notes (Signed)
Pt is angry. States he can see the waiting room is full of people and all he needs is a Rx so he can go. Educated on process. He may or may not stay.

## 2022-11-22 ENCOUNTER — Other Ambulatory Visit: Payer: Self-pay

## 2022-11-22 ENCOUNTER — Emergency Department (HOSPITAL_COMMUNITY): Payer: No Typology Code available for payment source

## 2022-11-22 ENCOUNTER — Inpatient Hospital Stay (HOSPITAL_COMMUNITY)
Admission: EM | Admit: 2022-11-22 | Discharge: 2022-11-27 | DRG: 897 | Disposition: A | Discharge status: Home Health | Payer: No Typology Code available for payment source | Attending: Infectious Diseases | Admitting: Infectious Diseases

## 2022-11-22 DIAGNOSIS — I10 Essential (primary) hypertension: Secondary | ICD-10-CM | POA: Diagnosis present

## 2022-11-22 DIAGNOSIS — E872 Acidosis, unspecified: Secondary | ICD-10-CM | POA: Diagnosis present

## 2022-11-22 DIAGNOSIS — E876 Hypokalemia: Secondary | ICD-10-CM | POA: Diagnosis present

## 2022-11-22 DIAGNOSIS — F10930 Alcohol use, unspecified with withdrawal, uncomplicated: Secondary | ICD-10-CM | POA: Diagnosis not present

## 2022-11-22 DIAGNOSIS — I471 Supraventricular tachycardia, unspecified: Secondary | ICD-10-CM | POA: Diagnosis not present

## 2022-11-22 DIAGNOSIS — Z7901 Long term (current) use of anticoagulants: Secondary | ICD-10-CM

## 2022-11-22 DIAGNOSIS — E860 Dehydration: Secondary | ICD-10-CM | POA: Diagnosis present

## 2022-11-22 DIAGNOSIS — R296 Repeated falls: Secondary | ICD-10-CM | POA: Diagnosis present

## 2022-11-22 DIAGNOSIS — N179 Acute kidney failure, unspecified: Secondary | ICD-10-CM | POA: Diagnosis present

## 2022-11-22 DIAGNOSIS — F10130 Alcohol abuse with withdrawal, uncomplicated: Secondary | ICD-10-CM | POA: Diagnosis present

## 2022-11-22 DIAGNOSIS — J449 Chronic obstructive pulmonary disease, unspecified: Secondary | ICD-10-CM | POA: Diagnosis present

## 2022-11-22 DIAGNOSIS — F4310 Post-traumatic stress disorder, unspecified: Secondary | ICD-10-CM | POA: Diagnosis present

## 2022-11-22 DIAGNOSIS — R509 Fever, unspecified: Secondary | ICD-10-CM | POA: Diagnosis present

## 2022-11-22 DIAGNOSIS — F32A Depression, unspecified: Secondary | ICD-10-CM | POA: Diagnosis present

## 2022-11-22 DIAGNOSIS — Z1152 Encounter for screening for COVID-19: Secondary | ICD-10-CM

## 2022-11-22 DIAGNOSIS — Z8249 Family history of ischemic heart disease and other diseases of the circulatory system: Secondary | ICD-10-CM

## 2022-11-22 DIAGNOSIS — R251 Tremor, unspecified: Secondary | ICD-10-CM | POA: Diagnosis present

## 2022-11-22 DIAGNOSIS — F17210 Nicotine dependence, cigarettes, uncomplicated: Secondary | ICD-10-CM | POA: Diagnosis present

## 2022-11-22 DIAGNOSIS — Z66 Do not resuscitate: Secondary | ICD-10-CM | POA: Diagnosis present

## 2022-11-22 DIAGNOSIS — F419 Anxiety disorder, unspecified: Secondary | ICD-10-CM | POA: Diagnosis present

## 2022-11-22 DIAGNOSIS — Z7952 Long term (current) use of systemic steroids: Secondary | ICD-10-CM

## 2022-11-22 DIAGNOSIS — K920 Hematemesis: Secondary | ICD-10-CM | POA: Diagnosis present

## 2022-11-22 DIAGNOSIS — K7010 Alcoholic hepatitis without ascites: Secondary | ICD-10-CM | POA: Diagnosis present

## 2022-11-22 DIAGNOSIS — F10939 Alcohol use, unspecified with withdrawal, unspecified: Secondary | ICD-10-CM | POA: Diagnosis present

## 2022-11-22 DIAGNOSIS — R42 Dizziness and giddiness: Principal | ICD-10-CM

## 2022-11-22 DIAGNOSIS — R7989 Other specified abnormal findings of blood chemistry: Secondary | ICD-10-CM | POA: Diagnosis present

## 2022-11-22 DIAGNOSIS — D539 Nutritional anemia, unspecified: Secondary | ICD-10-CM | POA: Diagnosis present

## 2022-11-22 DIAGNOSIS — K922 Gastrointestinal hemorrhage, unspecified: Secondary | ICD-10-CM

## 2022-11-22 DIAGNOSIS — I480 Paroxysmal atrial fibrillation: Secondary | ICD-10-CM | POA: Diagnosis present

## 2022-11-22 DIAGNOSIS — Z881 Allergy status to other antibiotic agents status: Secondary | ICD-10-CM | POA: Diagnosis not present

## 2022-11-22 DIAGNOSIS — Y905 Blood alcohol level of 100-119 mg/100 ml: Secondary | ICD-10-CM | POA: Diagnosis present

## 2022-11-22 DIAGNOSIS — W19XXXA Unspecified fall, initial encounter: Secondary | ICD-10-CM | POA: Diagnosis present

## 2022-11-22 DIAGNOSIS — Z79899 Other long term (current) drug therapy: Secondary | ICD-10-CM

## 2022-11-22 DIAGNOSIS — E869 Volume depletion, unspecified: Secondary | ICD-10-CM | POA: Diagnosis not present

## 2022-11-22 DIAGNOSIS — R195 Other fecal abnormalities: Secondary | ICD-10-CM | POA: Diagnosis present

## 2022-11-22 DIAGNOSIS — G8929 Other chronic pain: Secondary | ICD-10-CM | POA: Diagnosis present

## 2022-11-22 LAB — URINALYSIS, ROUTINE W REFLEX MICROSCOPIC
Bilirubin Urine: NEGATIVE
Glucose, UA: NEGATIVE mg/dL
Hgb urine dipstick: NEGATIVE
Ketones, ur: 20 mg/dL — AB
Nitrite: NEGATIVE
Protein, ur: 30 mg/dL — AB
Specific Gravity, Urine: 1.019 (ref 1.005–1.030)
pH: 5 (ref 5.0–8.0)

## 2022-11-22 LAB — RESPIRATORY PANEL BY PCR

## 2022-11-22 LAB — CBC WITH DIFFERENTIAL/PLATELET
Abs Immature Granulocytes: 0.05 10*3/uL (ref 0.00–0.07)
Basophils Absolute: 0 10*3/uL (ref 0.0–0.1)
Basophils Relative: 0 %
Eosinophils Absolute: 0 10*3/uL (ref 0.0–0.5)
Eosinophils Relative: 0 %
HCT: 32 % — ABNORMAL LOW (ref 39.0–52.0)
Hemoglobin: 11.7 g/dL — ABNORMAL LOW (ref 13.0–17.0)
Immature Granulocytes: 1 %
Lymphocytes Relative: 13 %
Lymphs Abs: 0.9 10*3/uL (ref 0.7–4.0)
MCH: 36.3 pg — ABNORMAL HIGH (ref 26.0–34.0)
MCHC: 36.6 g/dL — ABNORMAL HIGH (ref 30.0–36.0)
MCV: 99.4 fL (ref 80.0–100.0)
Monocytes Absolute: 0.6 10*3/uL (ref 0.1–1.0)
Monocytes Relative: 9 %
Neutro Abs: 5.5 10*3/uL (ref 1.7–7.7)
Neutrophils Relative %: 77 %
Platelets: 169 10*3/uL (ref 150–400)
RBC: 3.22 MIL/uL — ABNORMAL LOW (ref 4.22–5.81)
RDW: 14.5 % (ref 11.5–15.5)
WBC: 7.1 10*3/uL (ref 4.0–10.5)
nRBC: 1.1 % — ABNORMAL HIGH (ref 0.0–0.2)

## 2022-11-22 LAB — TROPONIN I (HIGH SENSITIVITY)
Troponin I (High Sensitivity): 31 ng/L — ABNORMAL HIGH (ref <18)
Troponin I (High Sensitivity): 21 ng/L — ABNORMAL HIGH (ref <18)

## 2022-11-22 LAB — COMPREHENSIVE METABOLIC PANEL
ALT: 86 U/L — ABNORMAL HIGH (ref 0–44)
AST: 229 U/L — ABNORMAL HIGH (ref 15–41)
Albumin: 2.6 g/dL — ABNORMAL LOW (ref 3.5–5.0)
Alkaline Phosphatase: 224 U/L — ABNORMAL HIGH (ref 38–126)
Anion gap: 23 — ABNORMAL HIGH (ref 5–15)
BUN: 33 mg/dL — ABNORMAL HIGH (ref 6–20)
CO2: 20 mmol/L — ABNORMAL LOW (ref 22–32)
Calcium: 7.8 mg/dL — ABNORMAL LOW (ref 8.9–10.3)
Chloride: 93 mmol/L — ABNORMAL LOW (ref 98–111)
Creatinine, Ser: 1.21 mg/dL (ref 0.61–1.24)
GFR, Estimated: >60 mL/min (ref >60)
Glucose, Bld: 70 mg/dL (ref 70–99)
Potassium: 3 mmol/L — ABNORMAL LOW (ref 3.5–5.1)
Sodium: 136 mmol/L (ref 135–145)
Total Bilirubin: 1.6 mg/dL — ABNORMAL HIGH (ref 0.3–1.2)
Total Protein: 6.5 g/dL (ref 6.5–8.1)

## 2022-11-22 LAB — RESP PANEL BY RT-PCR (RSV, FLU A&B, COVID)  RVPGX2
Influenza A by PCR: NEGATIVE
Influenza B by PCR: NEGATIVE
Resp Syncytial Virus by PCR: NEGATIVE
SARS Coronavirus 2 by RT PCR: NEGATIVE

## 2022-11-22 LAB — LACTIC ACID, PLASMA
Lactic Acid, Venous: 5 mmol/L (ref 0.5–1.9)
Lactic Acid, Venous: 5.5 mmol/L (ref 0.5–1.9)
Lactic Acid, Venous: 4.3 mmol/L (ref 0.5–1.9)
Lactic Acid, Venous: 2.6 mmol/L (ref 0.5–1.9)

## 2022-11-22 LAB — CBG MONITORING, ED
Glucose-Capillary: 132 mg/dL — ABNORMAL HIGH (ref 70–99)
Glucose-Capillary: 78 mg/dL (ref 70–99)
Glucose-Capillary: 147 mg/dL — ABNORMAL HIGH (ref 70–99)
Glucose-Capillary: 146 mg/dL — ABNORMAL HIGH (ref 70–99)

## 2022-11-22 LAB — BASIC METABOLIC PANEL
Anion gap: 13 (ref 5–15)
Anion gap: 10 (ref 5–15)
BUN: 26 mg/dL — ABNORMAL HIGH (ref 6–20)
BUN: 24 mg/dL — ABNORMAL HIGH (ref 6–20)
CO2: 24 mmol/L (ref 22–32)
CO2: 25 mmol/L (ref 22–32)
Calcium: 7.2 mg/dL — ABNORMAL LOW (ref 8.9–10.3)
Calcium: 7.5 mg/dL — ABNORMAL LOW (ref 8.9–10.3)
Chloride: 100 mmol/L (ref 98–111)
Chloride: 99 mmol/L (ref 98–111)
Creatinine, Ser: 1.07 mg/dL (ref 0.61–1.24)
Creatinine, Ser: 0.84 mg/dL (ref 0.61–1.24)
GFR, Estimated: >60 mL/min (ref >60)
GFR, Estimated: >60 mL/min (ref >60)
Glucose, Bld: 112 mg/dL — ABNORMAL HIGH (ref 70–99)
Glucose, Bld: 154 mg/dL — ABNORMAL HIGH (ref 70–99)
Potassium: 3.6 mmol/L (ref 3.5–5.1)
Potassium: 2.9 mmol/L — ABNORMAL LOW (ref 3.5–5.1)
Sodium: 137 mmol/L (ref 135–145)
Sodium: 134 mmol/L — ABNORMAL LOW (ref 135–145)

## 2022-11-22 LAB — POC OCCULT BLOOD, ED: Fecal Occult Bld: POSITIVE — AB

## 2022-11-22 LAB — I-STAT CHEM 8, ED
BUN: 43 mg/dL — ABNORMAL HIGH (ref 6–20)
Calcium, Ion: 0.92 mmol/L — ABNORMAL LOW (ref 1.15–1.40)
Chloride: 93 mmol/L — ABNORMAL LOW (ref 98–111)
Creatinine, Ser: 1.3 mg/dL — ABNORMAL HIGH (ref 0.61–1.24)
Glucose, Bld: 82 mg/dL (ref 70–99)
HCT: 39 % (ref 39.0–52.0)
Hemoglobin: 13.3 g/dL (ref 13.0–17.0)
Potassium: 3.5 mmol/L (ref 3.5–5.1)
Sodium: 134 mmol/L — ABNORMAL LOW (ref 135–145)
TCO2: 27 mmol/L (ref 22–32)

## 2022-11-22 LAB — PROTIME-INR
INR: 1 (ref 0.8–1.2)
Prothrombin Time: 13.2 seconds (ref 11.4–15.2)

## 2022-11-22 LAB — ETHANOL: Alcohol, Ethyl (B): 100 mg/dL — ABNORMAL HIGH (ref <10)

## 2022-11-22 LAB — TYPE AND SCREEN
ABO/RH(D): AB POS
Antibody Screen: NEGATIVE

## 2022-11-22 LAB — LIPASE, BLOOD: Lipase: 40 U/L (ref 11–51)

## 2022-11-22 LAB — ABO/RH: ABO/RH(D): AB POS

## 2022-11-22 MED ORDER — POTASSIUM CHLORIDE 10 MEQ/100ML IV SOLN
10.0000 meq | Freq: Once | INTRAVENOUS | Status: AC
  Administered 2022-11-22: 10 meq via INTRAVENOUS
  Filled 2022-11-22: qty 100

## 2022-11-22 MED ORDER — INSULIN ASPART 100 UNIT/ML IJ SOLN
0.0000 [IU] | INTRAMUSCULAR | Status: DC
  Administered 2022-11-22 – 2022-11-23 (×3): 2 [IU] via SUBCUTANEOUS
  Administered 2022-11-23: 3 [IU] via SUBCUTANEOUS
  Administered 2022-11-23: 5 [IU] via SUBCUTANEOUS
  Administered 2022-11-24 (×2): 2 [IU] via SUBCUTANEOUS
  Administered 2022-11-24: 5 [IU] via SUBCUTANEOUS
  Administered 2022-11-25 (×3): 2 [IU] via SUBCUTANEOUS
  Administered 2022-11-26: 3 [IU] via SUBCUTANEOUS
  Administered 2022-11-26 – 2022-11-27 (×5): 2 [IU] via SUBCUTANEOUS

## 2022-11-22 MED ORDER — LACTATED RINGERS IV SOLN: INTRAVENOUS | Status: DC

## 2022-11-22 MED ORDER — PANTOPRAZOLE SODIUM 40 MG PO TBEC
40.0000 mg | DELAYED_RELEASE_TABLET | Freq: Every day | ORAL | Status: DC
  Administered 2022-11-23 – 2022-11-27 (×5): 40 mg via ORAL
  Filled 2022-11-22 (×5): qty 1

## 2022-11-22 MED ORDER — FOLIC ACID 1 MG PO TABS
1.0000 mg | ORAL_TABLET | Freq: Every day | ORAL | Status: DC
  Administered 2022-11-22 – 2022-11-27 (×6): 1 mg via ORAL
  Filled 2022-11-22 (×6): qty 1

## 2022-11-22 MED ORDER — DEXTROSE IN LACTATED RINGERS 5 % IV SOLN: INTRAVENOUS | Status: DC

## 2022-11-22 MED ORDER — ADULT MULTIVITAMIN W/MINERALS CH
1.0000 | ORAL_TABLET | Freq: Every day | ORAL | Status: DC
  Administered 2022-11-22 – 2022-11-27 (×6): 1 via ORAL
  Filled 2022-11-22 (×6): qty 1

## 2022-11-22 MED ORDER — ENOXAPARIN SODIUM 40 MG/0.4ML IJ SOSY
40.0000 mg | PREFILLED_SYRINGE | INTRAMUSCULAR | Status: DC
  Filled 2022-11-22: qty 0.4

## 2022-11-22 MED ORDER — THIAMINE HCL 100 MG/ML IJ SOLN: 100.0000 mg | Freq: Every day | INTRAMUSCULAR | Status: DC

## 2022-11-22 MED ORDER — APIXABAN 5 MG PO TABS
5.0000 mg | ORAL_TABLET | Freq: Two times a day (BID) | ORAL | Status: DC
  Administered 2022-11-22: 5 mg via ORAL
  Filled 2022-11-22: qty 1

## 2022-11-22 MED ORDER — PANTOPRAZOLE 80MG IVPB - SIMPLE MED
80.0000 mg | Freq: Once | INTRAVENOUS | Status: AC
  Administered 2022-11-22: 80 mg via INTRAVENOUS
  Filled 2022-11-22: qty 100

## 2022-11-22 MED ORDER — LACTATED RINGERS IV BOLUS
1000.0000 mL | Freq: Once | INTRAVENOUS | Status: AC
  Administered 2022-11-22: 1000 mL via INTRAVENOUS

## 2022-11-22 MED ORDER — LORAZEPAM 1 MG PO TABS: 1.0000 mg | ORAL_TABLET | ORAL | Status: DC | PRN

## 2022-11-22 MED ORDER — ONDANSETRON HCL 4 MG/2ML IJ SOLN
4.0000 mg | Freq: Four times a day (QID) | INTRAMUSCULAR | Status: DC | PRN
  Administered 2022-11-23 (×2): 4 mg via INTRAVENOUS
  Filled 2022-11-22 (×2): qty 2

## 2022-11-22 MED ORDER — LORAZEPAM 2 MG/ML IJ SOLN
1.0000 mg | INTRAMUSCULAR | Status: DC | PRN
  Administered 2022-11-22 (×2): 1 mg via INTRAVENOUS
  Administered 2022-11-23: 2 mg via INTRAVENOUS
  Administered 2022-11-23: 1 mg via INTRAVENOUS
  Administered 2022-11-23: 2 mg via INTRAVENOUS
  Filled 2022-11-22 (×5): qty 1

## 2022-11-22 MED ORDER — LORAZEPAM 2 MG/ML IJ SOLN
1.0000 mg | Freq: Once | INTRAMUSCULAR | Status: AC
  Administered 2022-11-22: 1 mg via INTRAVENOUS
  Filled 2022-11-22: qty 1

## 2022-11-22 MED ORDER — NICOTINE 14 MG/24HR TD PT24
14.0000 mg | MEDICATED_PATCH | Freq: Every day | TRANSDERMAL | Status: DC
  Filled 2022-11-22 (×2): qty 1

## 2022-11-22 MED ORDER — SODIUM CHLORIDE 0.9 % IV BOLUS
1000.0000 mL | Freq: Once | INTRAVENOUS | Status: AC
  Administered 2022-11-22: 1000 mL via INTRAVENOUS

## 2022-11-22 MED ORDER — THIAMINE HCL 100 MG/ML IJ SOLN: 100.0000 mg | Freq: Once | INTRAMUSCULAR | Status: DC

## 2022-11-22 MED ORDER — THIAMINE MONONITRATE 100 MG PO TABS
100.0000 mg | ORAL_TABLET | Freq: Every day | ORAL | Status: DC
  Administered 2022-11-22: 100 mg via ORAL
  Filled 2022-11-22: qty 1

## 2022-11-22 MED ORDER — LORAZEPAM 2 MG/ML IJ SOLN
0.5000 mg | Freq: Once | INTRAMUSCULAR | Status: AC
  Administered 2022-11-22: 0.5 mg via INTRAVENOUS
  Filled 2022-11-22: qty 1

## 2022-11-22 NOTE — ED Provider Notes (Signed)
[place] EMERGENCY DEPARTMENT AT [facility] Provider Note   CSN: [id number] Arrival date & time: 11/22/22  4270     History  Chief Complaint  Patient presents with   Dizziness   Fall    Cory Reese is a 47 y.o. male.  47 year old male with prior medical history as detailed below presents for evaluation.  Patient reports weakness and dizziness x 3 to 4 days.  Patient reports a fall yesterday where he did strike his head.  Patient reports daily consumption of alcohol.  His last drink was earlier today.  He reports that he is compliant and is taking Eliquis as previously as prescribed for history of paroxysmal A-fib.  He reports nausea and vomiting that began this morning.  He reports seeing some bloody tinged emesis.  He reports dark, tarry stools for the last day or 2.  He denies prior history of GI bleed.  He denies prior GI bleeding.  He denies prior GI workup.  The history is provided by the patient and medical records.  Fall       Home Medications Prior to Admission medications   Medication Sig Start Date End Date Taking? Authorizing Provider  cephALEXin (KEFLEX) 500 MG capsule Take 1 capsule (500 mg total) by mouth 4 (four) times daily. 07/09/16   Mackuen, Courteney Lyn, MD  clobetasol (TEMOVATE) 0.05 % external solution APPLY 1 APPLICATION TOPICALLY 2 TIMES DAILY. 06/09/16   Henrietta Hoover, NP  cloNIDine (CATAPRES) 0.1 MG tablet Take 1 tablet (0.1 mg total) by mouth 2 (two) times daily. 04/13/16   Rodolph Bong, MD  cyclobenzaprine (FLEXERIL) 10 MG tablet Take 1 tablet (10 mg total) by mouth 3 (three) times daily as needed for muscle spasms. 01/29/18   Street, [place], PA-C  folic acid (FOLVITE) 1 MG tablet Take 1 tablet (1 mg total) by mouth daily. 04/13/16   Rodolph Bong, MD  gabapentin (NEURONTIN) 100 MG capsule Take 100 mg by mouth 3 (three) times daily.    [provider]  HYDROcodone-acetaminophen (NORCO/VICODIN) 5-325 MG  tablet Take 1 tablet by mouth every 6 (six) hours as needed for moderate pain. 04/13/16   Rodolph Bong, MD  ibuprofen (ADVIL,MOTRIN) 800 MG tablet Take 1 tablet (800 mg total) by mouth every 8 (eight) hours as needed for moderate pain. 01/29/19   Long, Arlyss Repress, MD  meloxicam (MOBIC) 15 MG tablet Take 1 tablet (15 mg total) by mouth daily. Patient not taking: Reported on 04/07/2016 12/10/15   Henrietta Hoover, NP  methocarbamol (ROBAXIN) 500 MG tablet Take 1 tablet (500 mg total) by mouth every 8 (eight) hours as needed for muscle spasms (back cramping). 01/29/19   LongArlyss Repress, MD  methylPREDNISolone (MEDROL DOSEPAK) 4 MG TBPK tablet Take as prescribed 01/05/20   Demetrios Loll T, PA-C  Multiple Vitamin (MULTIVITAMIN WITH MINERALS) TABS tablet Take 1 tablet by mouth daily.    [provider]  oxyCODONE-acetaminophen (PERCOCET/ROXICET) 5-325 MG tablet Take 1 tablet by mouth every 6 (six) hours as needed for severe pain. 07/09/16   Mackuen, Courteney Lyn, MD  pantoprazole (PROTONIX) 40 MG tablet Take 1 tablet (40 mg total) by mouth daily. Patient not taking: Reported on 05/22/2016 04/13/16   Rodolph Bong, MD  predniSONE (DELTASONE) 20 MG tablet Day 1 and 2: Take 3 tabs  Day 3-5: Take 2 tabs.  Day 5-8: take 1 tab 07/09/16   Mackuen, Courteney Lyn, MD  predniSONE (DELTASONE) 20 MG tablet 3  tabs po daily x 4 days 01/29/18   Street, [place], PA-C  senna-docusate (SENOKOT-S) 8.6-50 MG tablet Take 1 tablet by mouth at bedtime as needed for mild constipation. Patient not taking: Reported on 05/22/2016 04/13/16   Eugenie Filler, MD  thiamine 100 MG tablet Take 1 tablet (100 mg total) by mouth daily. Patient not taking: Reported on 05/22/2016 04/13/16   Eugenie Filler, MD  triamcinolone cream (KENALOG) 0.1 % Apply 1 application topically 2 (two) times daily. 01/05/20   Doristine Devoid, PA-C      Allergies    Doxycycline    Review of Systems   Review of Systems  All other systems  reviewed and are negative.   Physical Exam Updated Vital Signs BP 90/61   Pulse 91   Temp 98.4 F (36.9 C) (Oral)   Resp 18   Ht 6\' 2"  (1.88 m)   Wt 79.1 kg   SpO2 97%   BMI 22.39 kg/m  Physical Exam Vitals and nursing note reviewed.  Constitutional:      General: He is not in acute distress.    Appearance: Normal appearance. He is well-developed.  HENT:     Head: Normocephalic.     Comments: Contusions to left cheek and left temple. Eyes:     Conjunctiva/sclera: Conjunctivae normal.     Pupils: Pupils are equal, round, and reactive to light.  Cardiovascular:     Rate and Rhythm: Regular rhythm.     Heart sounds: Normal heart sounds.  Pulmonary:     Effort: Pulmonary effort is normal. No respiratory distress.     Breath sounds: Normal breath sounds.  Abdominal:     General: There is no distension.     Palpations: Abdomen is soft.     Tenderness: There is no abdominal tenderness.  Musculoskeletal:        General: No deformity. Normal range of motion.     Cervical back: Normal range of motion and neck supple.  Skin:    General: Skin is warm and dry.  Neurological:     General: No focal deficit present.     Mental Status: He is alert and oriented to person, place, and time.     ED Results / Procedures / Treatments   Labs (all labs ordered are listed, but only abnormal results are displayed) Labs Reviewed  LACTIC ACID, PLASMA - Abnormal; Notable for the following components:      Result Value   Lactic Acid, Venous 5.0 (*)    All other components within normal limits  CBC WITH DIFFERENTIAL/PLATELET - Abnormal; Notable for the following components:   RBC 3.22 (*)    Hemoglobin 11.7 (*)    HCT 32.0 (*)    MCH 36.3 (*)    MCHC 36.6 (*)    nRBC 1.1 (*)    All other components within normal limits  ETHANOL - Abnormal; Notable for the following components:   Alcohol, Ethyl (B) 100 (*)    All other components within normal limits  POC OCCULT BLOOD, ED - Abnormal;  Notable for the following components:   Fecal Occult Bld POSITIVE (*)    All other components within normal limits  I-STAT CHEM 8, ED - Abnormal; Notable for the following components:   Sodium 134 (*)    Chloride 93 (*)    BUN 43 (*)    Creatinine, Ser 1.30 (*)    Calcium, Ion 0.92 (*)    All other components within normal limits  RESP PANEL BY RT-PCR (RSV, FLU A&B, COVID)  RVPGX2  PROTIME-INR  LACTIC ACID, PLASMA  URINALYSIS, ROUTINE W REFLEX MICROSCOPIC  COMPREHENSIVE METABOLIC PANEL  LIPASE, BLOOD  TYPE AND SCREEN  ABO/RH  TROPONIN I (HIGH SENSITIVITY)  TROPONIN I (HIGH SENSITIVITY)    EKG EKG Interpretation  Date/Time:  Wednesday November 22 2022 06:37:39 EST Ventricular Rate:  127 PR Interval:  146 QRS Duration: 77 QT Interval:  331 QTC Calculation: 482 R Axis:   82 Text Interpretation: Sinus tachycardia Ventricular premature complex LAE, consider biatrial enlargement Borderline prolonged QT interval Baseline wander in lead(s) V1 Confirmed by Kristine Royal [phone]) on 11/22/2022 7:03:45 AM  Radiology DG Pelvis 1-2 Views  Result Date: 11/22/2022 CLINICAL DATA:  Fall pain EXAM: PELVIS - 1 VIEW; LEFT FEMUR 2 VIEWS COMPARISON:  09/10/2022 FINDINGS: No fracture, dislocation or subluxation identified. Heterotopic ossification about the left hip is consistent with old trauma. No osteolytic or osteoblastic changes. IMPRESSION: No acute osseous abnormalities. Electronically Signed   By: Layla Maw M.D.   On: 11/22/2022 08:44   DG Femur Min 2 Views Left  Result Date: 11/22/2022 CLINICAL DATA:  Fall pain EXAM: PELVIS - 1 VIEW; LEFT FEMUR 2 VIEWS COMPARISON:  09/10/2022 FINDINGS: No fracture, dislocation or subluxation identified. Heterotopic ossification about the left hip is consistent with old trauma. No osteolytic or osteoblastic changes. IMPRESSION: No acute osseous abnormalities. Electronically Signed   By: Layla Maw M.D.   On: 11/22/2022 08:44   CT Maxillofacial  WO CM  Result Date: 11/22/2022 CLINICAL DATA:  47 year old male with dizziness and fall. EXAM: CT MAXILLOFACIAL WITHOUT CONTRAST TECHNIQUE: Multidetector CT imaging of the maxillofacial structures was performed. Multiplanar CT image reconstructions were also generated. RADIATION DOSE REDUCTION: This exam was performed according to the departmental dose-optimization program which includes automated exposure control, adjustment of the mA and/or kV according to patient size and/or use of iterative reconstruction technique. COMPARISON:  Head CT today. FINDINGS: Osseous: Mandible intact and normally located. Crowding of mandible incisor dentition. No acute dental finding identified. Maxilla, zygoma, pterygoid, and nasal bones appear intact. Central skull base appears intact. Visible cervical vertebrae appear intact and aligned. Orbits: Intact orbital walls. Orbits soft tissues appears symmetric and normal. Sinuses: Clear throughout. Soft tissues: Negative visible noncontrast supraglottic larynx, pharynx, parapharyngeal spaces, retropharyngeal space, sublingual space, submandibular spaces, masticator and parotid spaces. No superficial soft tissue injury identified. No upper cervical lymphadenopathy. Limited intracranial: Stable to that reported separately today. IMPRESSION: No acute traumatic injury identified in the Face. Electronically Signed   By: Odessa Fleming M.D.   On: 11/22/2022 08:03   CT Head Wo Contrast  Result Date: 11/22/2022 CLINICAL DATA:  47 year old male with dizziness and fall. History of atrial fibrillation and right basal ganglia infarct in 2021. EXAM: CT HEAD WITHOUT CONTRAST TECHNIQUE: Contiguous axial images were obtained from the base of the skull through the vertex without intravenous contrast. RADIATION DOSE REDUCTION: This exam was performed according to the departmental dose-optimization program which includes automated exposure control, adjustment of the mA and/or kV according to patient size  and/or use of iterative reconstruction technique. COMPARISON:  Head CT 05/23/2020.  CTA head and neck 05/25/2020. FINDINGS: Brain: Stable cerebral volume. No midline shift, ventriculomegaly, mass effect, evidence of mass lesion, intracranial hemorrhage or evidence of cortically based acute infarction. Subtle encephalomalacia in the right lentiform corresponding to 2021 ischemia. Gray-white matter differentiation elsewhere appears stable and within normal limits. Vascular: No suspicious intracranial vascular hyperdensity. Skull: No acute osseous abnormality identified.  Sinuses/Orbits: Visualized paranasal sinuses and mastoids are stable and well aerated. Other: No acute orbit or scalp soft tissue injury identified. IMPRESSION: 1. No acute intracranial abnormality or acute traumatic injury identified. 2. Subtle encephalomalacia in the right lentiform corresponding to 2021 ischemia. Electronically Signed   By: Genevie Ann M.D.   On: 11/22/2022 07:59   DG Chest Port 1 View  Result Date: 11/22/2022 CLINICAL DATA:  Weakness and dizziness.  Frequent falls. EXAM: PORTABLE CHEST 1 VIEW COMPARISON:  Portable chest 06/14/2022. FINDINGS: The cardiomediastinal silhouette and vascular pattern are normal. The lungs are mildly emphysematous but clear. No pleural effusion is seen. No acute osseous findings or visible displaced rib fractures. There are multiple overlying monitor wires. IMPRESSION: No acute radiographic chest findings.  Stable COPD chest. Electronically Signed   By: Telford Nab M.D.   On: 11/22/2022 07:49    Procedures Procedures    Medications Ordered in ED Medications  pantoprazole (PROTONIX) 80 mg /NS 100 mL IVPB (has no administration in time range)  LORazepam (ATIVAN) injection 0.5 mg (has no administration in time range)  sodium chloride 0.9 % bolus 1,000 mL (1,000 mLs Intravenous New Bag/Given 11/22/22 0903)    ED Course/ Medical Decision Making/ A&P                             Medical  Decision Making Amount and/or Complexity of Data Reviewed Labs: ordered. Radiology: ordered.  Risk Prescription drug management. Decision regarding hospitalization.    Medical Screen Complete  This patient presented to the ED with complaint of weak, dizzy, fall, head injury.  This complaint involves an extensive number of treatment options. The initial differential diagnosis includes, but is not limited to, trauma related to fall, dehydration, GI bleed, metabolic abnormality, EtOH withdrawal, etc.  This presentation is: Acute, Chronic, Self-Limited, Previously Undiagnosed, Uncertain Prognosis, Complicated, Systemic Symptoms, and Threat to Life/Bodily Function  Patient presents with weakness and dizziness.  Patient does report fall with head injury.  Patient is reporting compliance with Eliquis as prescribed for paroxysmal A-fib.  Patient reports heavy alcohol use on a daily basis.  His last drink was earlier today.  Patient is also complaining of bloody tinged emesis and dark tarry stool.  Patient's Hemoccult is positive.  EtOH is 100.  Lactic acid is elevated at 5.  Initial hemoglobin is 11.7.  Patient given IV fluids, benzodiazepines, Protonix.  Patient would benefit from admission for further workup and treatment.    Additional history obtained:  External records from outside sources obtained and reviewed including prior ED visits and prior Inpatient records.    Lab Tests:  I ordered and personally interpreted labs.  The pertinent results include: CBC, CMP, lipase, lactic acid, stool Hemoccult   Imaging Studies ordered:  I ordered imaging studies including CT head, CT maxillofacial, plain films of chest, pelvis, left femur I independently visualized and interpreted obtained imaging which showed no acute fracture or traumatic injury I agree with the radiologist interpretation.   Cardiac Monitoring:  The patient was maintained on a cardiac monitor.  I  personally viewed and interpreted the cardiac monitor which showed an underlying rhythm of: sinus tach   Medicines ordered:  I ordered medication including IV fluids, Ativan, Protonix for dehydration, suspected withdrawal, suspected GI bleed Reevaluation of the patient after these medicines showed that the patient: stayed the same  Problem List / ED Course:  Weakness, fall, head injury, GI bleed, alcohol withdrawal, dehydration,  hypokalemia   Reevaluation:  After the interventions noted above, I reevaluated the patient and found that they have: improved   Disposition:  After consideration of the diagnostic results and the patients response to treatment, I feel that the patent would benefit from admission.   CRITICAL CARE Performed by: Wynetta Fines   Total critical care time: 30 minutes  Critical care time was exclusive of separately billable procedures and treating other patients.  Critical care was necessary to treat or prevent imminent or life-threatening deterioration.  Critical care was time spent personally by me on the following activities: development of treatment plan with patient and/or surrogate as well as nursing, discussions with consultants, evaluation of patient's response to treatment, examination of patient, obtaining history from patient or surrogate, ordering and performing treatments and interventions, ordering and review of laboratory studies, ordering and review of radiographic studies, pulse oximetry and re-evaluation of patient's condition.          Final Clinical Impression(s) / ED Diagnoses Final diagnoses:  Dizziness  Fall, initial encounter  Alcohol withdrawal syndrome without complication (HCC)  Gastrointestinal hemorrhage, unspecified gastrointestinal hemorrhage type    Rx / DC Orders ED Discharge Orders     None         Wynetta Fines, MD 11/22/22 1115

## 2022-11-22 NOTE — ED Notes (Addendum)
Patient requesting sandwich and juice.

## 2022-11-22 NOTE — ED Notes (Signed)
Patient assisted to bedpan.

## 2022-11-22 NOTE — H&P (Addendum)
NAME:  Cory Reese, MRN:  [id number], DOB:  Jun 29, 1976, LOS: 0 ADMISSION DATE:  11/22/2022, Primary: Patient, No Pcp Per  CHIEF COMPLAINT:  dizziness   Medical Service: Internal Medicine Teaching Service         Attending Physician: Dr. Campbell Riches, MD    First Contact: Dr. Nikki Dom Pager: [phone]  Second Contact: Dr. Marlou Sa Pager: [phone]       After Hours (After 5p/  First Contact Pager: [phone]  weekends / holidays): Second Contact Pager: Cory Reese   Cory Reese is 47yo person with history of alcohol abuse with previous delirium tremens, chronic back pain, eczema presenting to [facility] with constellation of dizziness, cough, nausea, vomiting, and tremors. Patient reports his symptoms initially started a few days ago with him feeling off balance. He reports multiple falls, one on his right side and another to the front left part of his face. He describes him feeling off balance during these episodes, but it was not constant, as there were other times where he did not feel dizzy. He reports around the same time he started having non-productive cough and intermittent subjective fevers, chills, and vomiting. Initially his vomit did not have blood, but there have been a few times with streaks of red blood since then. He does report recent dark stools, but no bleeding in the toilet or toilet paper. He does report significant alcohol use history, typically drinking 2 fifths of liquor every other day, last drink yesterday 1/23.   PCP: Patient, No Pcp Per  ED COURSE   Patient arrived to [facility] afebrile, tachycardic, sating well on room air. Initial lab work revealed anion gap metabolic acidosis with lactic acidosis, elevated LFT's, mild acute kidney injury,    PAST MEDICAL HISTORY   He,  has a past medical history of Back pain, Depression with anxiety, Eczema, Hypertension, PTSD (post-traumatic stress disorder), and Seizures ([place]).   HOME MEDICATIONS    Prior to Admission medications   Medication Sig Start Date End Date Taking? Authorizing Provider  cephALEXin (KEFLEX) 500 MG capsule Take 1 capsule (500 mg total) by mouth 4 (four) times daily. 07/09/16   Mackuen, Courteney Lyn, MD  clobetasol (TEMOVATE) 0.05 % external solution APPLY 1 APPLICATION TOPICALLY 2 TIMES DAILY. 06/09/16   Micheline Chapman, NP  cloNIDine (CATAPRES) 0.1 MG tablet Take 1 tablet (0.1 mg total) by mouth 2 (two) times daily. 04/13/16   Eugenie Filler, MD  cyclobenzaprine (FLEXERIL) 10 MG tablet Take 1 tablet (10 mg total) by mouth 3 (three) times daily as needed for muscle spasms. 01/29/18   Street, [place], PA-C  folic acid (FOLVITE) 1 MG tablet Take 1 tablet (1 mg total) by mouth daily. 04/13/16   Eugenie Filler, MD  gabapentin (NEURONTIN) 100 MG capsule Take 100 mg by mouth 3 (three) times daily.    [provider]  HYDROcodone-acetaminophen (NORCO/VICODIN) 5-325 MG tablet Take 1 tablet by mouth every 6 (six) hours as needed for moderate pain. 04/13/16   Eugenie Filler, MD  ibuprofen (ADVIL,MOTRIN) 800 MG tablet Take 1 tablet (800 mg total) by mouth every 8 (eight) hours as needed for moderate pain. 01/29/19   Long, Wonda Olds, MD  meloxicam (MOBIC) 15 MG tablet Take 1 tablet (15 mg total) by mouth daily. Patient not taking: Reported on 04/07/2016 12/10/15   Micheline Chapman, NP  methocarbamol (ROBAXIN) 500 MG tablet Take 1 tablet (500 mg total) by mouth every 8 (eight) hours  as needed for muscle spasms (back cramping). 01/29/19   LongWonda Olds, MD  methylPREDNISolone (MEDROL DOSEPAK) 4 MG TBPK tablet Take as prescribed 01/05/20   Ocie Cornfield T, PA-C  Multiple Vitamin (MULTIVITAMIN WITH MINERALS) TABS tablet Take 1 tablet by mouth daily.    [provider]  oxyCODONE-acetaminophen (PERCOCET/ROXICET) 5-325 MG tablet Take 1 tablet by mouth every 6 (six) hours as needed for severe pain. 07/09/16   Mackuen, Courteney Lyn, MD  pantoprazole (PROTONIX) 40  MG tablet Take 1 tablet (40 mg total) by mouth daily. Patient not taking: Reported on 05/22/2016 04/13/16   Eugenie Filler, MD  predniSONE (DELTASONE) 20 MG tablet Day 1 and 2: Take 3 tabs  Day 3-5: Take 2 tabs.  Day 5-8: take 1 tab 07/09/16   Mackuen, Courteney Lyn, MD  predniSONE (DELTASONE) 20 MG tablet 3 tabs po daily x 4 days 01/29/18   Street, [place], PA-C  senna-docusate (SENOKOT-S) 8.6-50 MG tablet Take 1 tablet by mouth at bedtime as needed for mild constipation. Patient not taking: Reported on 05/22/2016 04/13/16   Eugenie Filler, MD  thiamine 100 MG tablet Take 1 tablet (100 mg total) by mouth daily. Patient not taking: Reported on 05/22/2016 04/13/16   Eugenie Filler, MD  triamcinolone cream (KENALOG) 0.1 % Apply 1 application topically 2 (two) times daily. 01/05/20   Ocie Cornfield T, PA-C    ALLERGIES   Allergies as of 11/22/2022 - Review Complete 11/22/2022  Allergen Reaction Noted   Doxycycline Rash 05/13/2015    SOCIAL HISTORY   Lives alone in apartment, can complete ADL and IADL's independently. Current daily smoker, 1 pack over two days. Two fifths of liquor every other day for at least the last year. No recreational drugs.   FAMILY HISTORY   His family history includes Hypertension in his father and mother. There is no history of Hyperlipidemia, Heart attack, Diabetes, or Sudden death.   REVIEW OF SYSTEMS   ROS per history of present illness.  PHYSICAL EXAMINATION   Blood pressure 133/81, pulse (!) 130, temperature 98.4 F (36.9 C), temperature source Oral, resp. rate 16, height 6\' 2"  (1.88 m), weight 79.1 kg, SpO2 100 %.    Filed Weights   11/22/22 0627  Weight: 79.1 kg   GENERAL: Ill-appearing person laying in bed, no acute distress HENT: Normocephalic, atraumatic. Dry mucous membranes. EYES: Vision grossly in tact. No conjunctival injection, mild scleral icterus.  CV: Tachycardic, regular rhythm. No murmurs appreciated. Warm extremities.  PULM:  Normal work of breathing on room air. Clear to auscultation bilaterally. GI: Abdomen soft, non-distended, non-tender. Normoactive bowel sounds.  MSK: Normal bulk, tone. No peripheral edema noted. SKIN: Warm, dry. No rashes or lesions appreciated.  NEURO: Somnolent, but arousable, conversing appropriately. Cranial nerves in tact. No nystagmus bilaterally. Strength 4/5 bilateral upper extremities, 3/5 bilateral lower extremities. Finger to nose normal. No asterixis. Tremors at rest and with movement bilateral upper extremities. PSYCH: Normal mood, affect, speech.  SIGNIFICANT DIAGNOSTIC TESTS   ECG: Sinus tachycardia, normal axis. No ST changes from previous.   I personally reviewed patient's ECG with my interpretation as above.  CXR: No airspace disease appreciated. No effusions.  I personally reviewed patient's CXR with my interpretation as above.  CT HEAD: No acute intracranial abnormality or acute traumatic injury identified. Subtle encephalomalacia in the right lentiform corresponding to 2021 ischemia.  CT MAXILLOFACIAL: No acute traumatic injury identified in the face.   PELVIC, FEMUR XR: No acute osseous abnormalities.  LABS  Latest Ref Rng & Units 11/22/2022    7:31 AM 11/22/2022    6:45 AM 05/22/2016   10:37 AM  CBC  WBC 4.0 - 10.5 K/uL  7.1  7.4   Hemoglobin 13.0 - 17.0 g/dL 81.4  48.1  85.6   Hematocrit 39.0 - 52.0 % 39.0  32.0  37.5   Platelets 150 - 400 K/uL  169  253       Latest Ref Rng & Units 11/22/2022    8:55 AM 11/22/2022    7:31 AM 05/22/2016   10:37 AM  BMP  Glucose 70 - 99 mg/dL 70  82  93   BUN 6 - 20 mg/dL 33  43  11   Creatinine 0.61 - 1.24 mg/dL 3.14  9.70  2.63   Sodium 135 - 145 mmol/L 136  134  140   Potassium 3.5 - 5.1 mmol/L 3.0  3.5  4.1   Chloride 98 - 111 mmol/L 93  93  105   CO2 22 - 32 mmol/L 20   24   Calcium 8.9 - 10.3 mg/dL 7.8   9.0     CONSULTS   na  ASSESSMENT   Ravon Mortellaro is 46yo person with history of alcohol abuse  with previous delirium tremens, chronic back pain, eczema admitted 1/24 with acute alcohol withdrawal syndrome.   PLAN   Principal Problem:   Alcohol withdrawal (HCC)  #Alcohol withdrawal Patient is presenting to ED after significant alcohol intake and dizziness over last few days. Patient reports drinking ~35 standard drinks of liquor yesterday. Today upon arrival he is tachycardic, appears dry on exam. Patient is at high risk given he previously has had delirium tremens. There is also a reported history of seizures, I am uncertain if this is related to his previous alcohol withdrawal vs primary epileptic syndrome. He is not on any anti-epileptics. We will start him on CIWA with Ativan and place [facility] consult for substance abuse assistance. - Start CIWA w/ Ativan - Thiamine, folic acid - TOC consult - Zofran PRN  #Acute kidney injury #Anion gap metabolic acidosis #Lactic acidosis sCr initially 1.3 on arrival, previous known renal function in a few months ago sCr 0.6 per Care Everywhere. Lactic initially elevated to 5.0 w/ repeat elevated further to 5.5. Most likely acidosis multi-factorial with alcohol ketosis, starvation ketosis. Will plan to give fluids, initiate dextrose with fluids after thiamine is given.  - Initiate IVF - Thiamine followed by LR w/ dextrose - Can d/c fluids after gap closes - Follow-up BMP q6h - SSI - Strict I/O  #Dizziness Patient reports his dizziness started a few days ago. He does have multiple centrally acting medications listed, including two muscle relaxers, gabapentin, Norco, and Percocet. However, his PDMP does not show any of these have been prescribed recently. His symptoms are not concerning for stroke given intermittent symptoms and normal neuro exam. It is possible alcohol intake is contributing to this. In addition, he does report intermittent cough and subjective fever, could consider underlying viral illness - COVID and flu negative, pending RVP. Once  out of alcohol withdrawal can also consider vestibular rehabilitation.  - Hold all centrally acting meds - Follow-up respiratory viral panel - Consider vestibular PT after alcohol withdrawal  #Hematemesis #Positive occult blood Patient does report some streaks of blood in vomit, but only after he started vomiting for a few days. Also reports dark stools, denies any red blood in toilet or on toilet paper. In ED, positive occult blood test.  Hgb at baseline on initial labs, coagulation studies normal. He is tachycardic, although I believe this is most likely 2/2 alcohol withdrawal rather than blood volume loss. Exam benign, no abdominal pain or blood in emesis bag at bedside. Believe blood in vomit is likely from some esophageal irritation from multiple episodes of vomiting. Will put him on clear liquid diet for now, advance as able. IV pantoprazole was given in ED, will continue with his daily Protonix starting tomorrow. Will also continue monitoring daily blood counts. If drops would d/c his Eliquis.  - Pantoprazole 40mg  daily - Clear liquid diet, ADAT - Zofran PRN - Daily CBC  #Paroxysmal atrial fibrillation Patient currently tachycardic, but sinus rhythm. Will continue with home Eliquis. Unclear of other current medications.  - Eliquis 5mg  twice daily  #Hypokalemia K 3.5>3.0 since arrival. This is being repleted by ED. Will continue to monitor electrolytes, including Mag and phos given he is at risk for refeeding syndrome with his poor nutritional status. 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 Tbili 1.6. No stigmata of cirrhosis on exam, synthetic function appears to be in tact. Would continue to trend these for now.  - Daily hepatic function panel  #Hypertension Patient has clonidine listed as home medication, although I am unsure if he is actually taking this. His blood pressure is normal now. Will hold off  re-starting, would re-start if he has rebound hypertension. - Hold home clonidine  #Tobacco use disorder - Nicotine patch ordered  [facility]   DIET: CLD IVF: LR w/ dextrose DVT PPX: lovenox BOWEL: na CODE: DNR FAM COM: na  DISPO: Admit patient to Inpatient with expected length of stay greater than 2 midnights.  , MD Internal Medicine Resident PGY-3 Pager [phone] 11/22/22 1:09 PM

## 2022-11-22 NOTE — ED Triage Notes (Signed)
Pt has been dizzy for several days and had multiple falls.  States he hit head yesterday and is on Eliquis

## 2022-11-22 NOTE — ED Notes (Signed)
Patient cleaned and changed at this time.

## 2022-11-22 NOTE — ED Notes (Signed)
Patient turned, cleaned, and changed.

## 2022-11-23 DIAGNOSIS — F10939 Alcohol use, unspecified with withdrawal, unspecified: Secondary | ICD-10-CM | POA: Diagnosis not present

## 2022-11-23 LAB — CBC
HCT: 23.5 % — ABNORMAL LOW (ref 39.0–52.0)
HCT: 25.4 % — ABNORMAL LOW (ref 39.0–52.0)
Hemoglobin: 8.4 g/dL — ABNORMAL LOW (ref 13.0–17.0)
Hemoglobin: 8.6 g/dL — ABNORMAL LOW (ref 13.0–17.0)
MCH: 36.7 pg — ABNORMAL HIGH (ref 26.0–34.0)
MCH: 35.5 pg — ABNORMAL HIGH (ref 26.0–34.0)
MCHC: 35.7 g/dL (ref 30.0–36.0)
MCHC: 33.9 g/dL (ref 30.0–36.0)
MCV: 102.6 fL — ABNORMAL HIGH (ref 80.0–100.0)
MCV: 105 fL — ABNORMAL HIGH (ref 80.0–100.0)
Platelets: 128 10*3/uL — ABNORMAL LOW (ref 150–400)
Platelets: 131 10*3/uL — ABNORMAL LOW (ref 150–400)
RBC: 2.29 MIL/uL — ABNORMAL LOW (ref 4.22–5.81)
RBC: 2.42 MIL/uL — ABNORMAL LOW (ref 4.22–5.81)
RDW: 14.5 % (ref 11.5–15.5)
RDW: 14 % (ref 11.5–15.5)
WBC: 7.8 10*3/uL (ref 4.0–10.5)
WBC: 8.6 10*3/uL (ref 4.0–10.5)
nRBC: 1.7 % — ABNORMAL HIGH (ref 0.0–0.2)
nRBC: 1.2 % — ABNORMAL HIGH (ref 0.0–0.2)

## 2022-11-23 LAB — PHOSPHORUS: Phosphorus: 1.1 mg/dL — ABNORMAL LOW (ref 2.5–4.6)

## 2022-11-23 LAB — BASIC METABOLIC PANEL
Anion gap: 6 (ref 5–15)
BUN: 18 mg/dL (ref 6–20)
CO2: 29 mmol/L (ref 22–32)
Calcium: 7.5 mg/dL — ABNORMAL LOW (ref 8.9–10.3)
Chloride: 100 mmol/L (ref 98–111)
Creatinine, Ser: 0.74 mg/dL (ref 0.61–1.24)
GFR, Estimated: >60 mL/min (ref >60)
Glucose, Bld: 168 mg/dL — ABNORMAL HIGH (ref 70–99)
Potassium: 2.8 mmol/L — ABNORMAL LOW (ref 3.5–5.1)
Sodium: 135 mmol/L (ref 135–145)

## 2022-11-23 LAB — CBG MONITORING, ED
Glucose-Capillary: 237 mg/dL — ABNORMAL HIGH (ref 70–99)
Glucose-Capillary: 83 mg/dL (ref 70–99)
Glucose-Capillary: 172 mg/dL — ABNORMAL HIGH (ref 70–99)
Glucose-Capillary: 133 mg/dL — ABNORMAL HIGH (ref 70–99)
Glucose-Capillary: 153 mg/dL — ABNORMAL HIGH (ref 70–99)

## 2022-11-23 LAB — HEPATITIS PANEL, ACUTE
HCV Ab: NONREACTIVE
Hep A IgM: NONREACTIVE
Hep B C IgM: NONREACTIVE
Hepatitis B Surface Ag: NONREACTIVE

## 2022-11-23 LAB — MAGNESIUM: Magnesium: 1.8 mg/dL (ref 1.7–2.4)

## 2022-11-23 LAB — HEPATIC FUNCTION PANEL
ALT: 59 U/L — ABNORMAL HIGH (ref 0–44)
AST: 125 U/L — ABNORMAL HIGH (ref 15–41)
Albumin: 1.9 g/dL — ABNORMAL LOW (ref 3.5–5.0)
Alkaline Phosphatase: 178 U/L — ABNORMAL HIGH (ref 38–126)
Bilirubin, Direct: 0.3 mg/dL — ABNORMAL HIGH (ref 0.0–0.2)
Indirect Bilirubin: 0.8 mg/dL (ref 0.3–0.9)
Total Bilirubin: 1.1 mg/dL (ref 0.3–1.2)
Total Protein: 5 g/dL — ABNORMAL LOW (ref 6.5–8.1)

## 2022-11-23 LAB — GLUCOSE, CAPILLARY
Glucose-Capillary: 85 mg/dL (ref 70–99)
Glucose-Capillary: 132 mg/dL — ABNORMAL HIGH (ref 70–99)

## 2022-11-23 LAB — RPR: RPR Ser Ql: NONREACTIVE

## 2022-11-23 LAB — HIV ANTIBODY (ROUTINE TESTING W REFLEX): HIV Screen 4th Generation wRfx: NONREACTIVE

## 2022-11-23 MED ORDER — CHLORDIAZEPOXIDE HCL 5 MG PO CAPS
25.0000 mg | ORAL_CAPSULE | Freq: Four times a day (QID) | ORAL | Status: AC | PRN
  Filled 2022-11-23: qty 5

## 2022-11-23 MED ORDER — HYDROXYZINE HCL 25 MG PO TABS: 25.0000 mg | ORAL_TABLET | Freq: Four times a day (QID) | ORAL | Status: AC | PRN

## 2022-11-23 MED ORDER — LOPERAMIDE HCL 2 MG PO CAPS: 2.0000 mg | ORAL_CAPSULE | ORAL | Status: AC | PRN

## 2022-11-23 MED ORDER — CHLORDIAZEPOXIDE HCL 5 MG PO CAPS
25.0000 mg | ORAL_CAPSULE | Freq: Four times a day (QID) | ORAL | Status: AC
  Administered 2022-11-23 (×4): 25 mg via ORAL
  Filled 2022-11-23 (×3): qty 5
  Filled 2022-11-23 (×2): qty 1

## 2022-11-23 MED ORDER — MAGNESIUM SULFATE 2 GM/50ML IV SOLN
2.0000 g | Freq: Once | INTRAVENOUS | Status: AC
  Administered 2022-11-23: 2 g via INTRAVENOUS
  Filled 2022-11-23: qty 50

## 2022-11-23 MED ORDER — CHLORDIAZEPOXIDE HCL 5 MG PO CAPS
25.0000 mg | ORAL_CAPSULE | Freq: Every day | ORAL | Status: AC
  Administered 2022-11-26: 25 mg via ORAL
  Filled 2022-11-23: qty 5

## 2022-11-23 MED ORDER — POTASSIUM CHLORIDE 10 MEQ/100ML IV SOLN
10.0000 meq | INTRAVENOUS | Status: AC
  Administered 2022-11-23 (×4): 10 meq via INTRAVENOUS
  Filled 2022-11-23 (×4): qty 100

## 2022-11-23 MED ORDER — POTASSIUM PHOSPHATES 15 MMOLE/5ML IV SOLN
30.0000 mmol | Freq: Once | INTRAVENOUS | Status: AC
  Administered 2022-11-23: 30 mmol via INTRAVENOUS
  Filled 2022-11-23: qty 10

## 2022-11-23 MED ORDER — CHLORDIAZEPOXIDE HCL 5 MG PO CAPS
25.0000 mg | ORAL_CAPSULE | Freq: Three times a day (TID) | ORAL | Status: AC
  Administered 2022-11-24 (×3): 25 mg via ORAL
  Filled 2022-11-23 (×3): qty 5

## 2022-11-23 MED ORDER — POTASSIUM CHLORIDE 10 MEQ/100ML IV SOLN
10.0000 meq | Freq: Once | INTRAVENOUS | Status: AC
  Administered 2022-11-23: 10 meq via INTRAVENOUS
  Filled 2022-11-23: qty 100

## 2022-11-23 MED ORDER — CHLORDIAZEPOXIDE HCL 5 MG PO CAPS
25.0000 mg | ORAL_CAPSULE | ORAL | Status: AC
  Administered 2022-11-25 (×2): 25 mg via ORAL
  Filled 2022-11-23: qty 5

## 2022-11-23 MED ORDER — THIAMINE HCL 100 MG/ML IJ SOLN
100.0000 mg | Freq: Once | INTRAMUSCULAR | Status: AC
  Administered 2022-11-23: 100 mg via INTRAMUSCULAR
  Filled 2022-11-23: qty 2

## 2022-11-23 NOTE — ED Notes (Signed)
Pt appears lethargic but answers questions appropriately. Denies being in pain.

## 2022-11-23 NOTE — ED Notes (Signed)
Orange juice and crackers provided to the pt.

## 2022-11-23 NOTE — Hospital Course (Addendum)
Cory Reese is a 47 y.o. with a pertinent PMH of alcohol use disorder with previous delirium tremens who presented with nausea, vomiting, hematemesis, tremors, and cough and admitted for alcohol withdrawal.      Alcohol withdrawal Alcohol use disorder Presented on 11/22/2022 with nausea, vomiting, hematemesis, tremors, and cough.  Last drink 1/23.  On initial exam he was tachycardic and disoriented.  He was started on CIWA with Ativan as well as thiamine, folic acid, and Zofran as needed.  Initially his CIWA scores were quite high and he was started on a Librium taper at that time.  After about 24 hours in the hospital he became less disoriented.  We discussed alcohol cessation and naltrexone which he was interested in.  Other than his persistent tachycardia described below he remained stable throughout admission on the Librium taper and did not have any other signs of withdrawal return after completing it.  He was discharged with naltrexone and will be set up with a follow-up appointment in our clinic.   Persistent tachycardia Patient was persistently tachycardic during his admission with rates predominantly from 100-130.  This was thought to be most likely secondary to his ongoing alcohol withdrawal, poor p.o. intake, anemia, and physical deconditioning.  He worked with physical therapy who recommended SNF but patient strongly preferred home health.  He did respond partially to IV fluids and was encouraged to have good p.o. intake.  I think he should have a follow-up EKG after a period of good p.o. intake and increased activity.  If still persistently tachycardic and without significant anemia this may warrant further evaluation.  Macrocytic anemia Hematemesis Patient presented after 2 episodes of blood-streaked emesis.  Hemoglobin was initially 11.7 but then dropped to 8.4 following morning and remained stable there during the rest of the admission.  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  His Eliquis was held during admission and if still anemic outside of the hospital he should have further workup with vitamin B12, folate, and iron studies.  Elevated transaminases AST and ALT were elevated at 125 and 59 on admission with T. bili of 1.1 and alkaline phosphatase of 178.  AST and ALT normalized during admission and T. bili decreased to 0.4.  Alkaline phosphatase remains elevated at 130.  Most likely due to mild alcoholic hepatitis.  AKI Anion gap metabolic acidosis/lactic acidosis Hypokalemia/hypophosphatemia/hypomagnesemia Presented with creatinine of 1.30 up from baseline 0.7-0.8.  Patient is lactic acid 5.0 on admission which increased to 5.5 but then eventually decreased to 2.6.  After IV fluid rehydration his lactic acidosis improved and creatinine remained stable at baseline and was 0.62 on day of discharge.  He also had recurrent hypokalemia down to 2.8 despite daily supplementation.  We will send him out with potassium chloride 20 mill equivalents daily and recommend follow-up labs to either increase or discontinue supplementation.   Dizziness On admission patient reported some dizziness recently.  We are most concerned that his centrally acting medications and alcohol use were likely contributory.  His dizziness did not initially improve as his mental status improved but he did not complain of any dizziness on his day of discharge.    Paroxysmal atrial fibrillation Stable during admission with tachycardia described above but no atrial fibrillation.  On Eliquis at home which held due to anemia and if his blood counts are stable or improved on follow-up he  may restart this.   Hypertension On clonidine at home which we held during admission as he stated he does not using it anyway.  He did not have any notable rebound hypertension.  This was discontinued on  discharge.   Tobacco use disorder During admission we encouraged tobacco cessation and discharged him with a prescription for nicotine patches.

## 2022-11-23 NOTE — ED Notes (Signed)
Pt has the hiccups. Pt states he always has them. Pt denies being nauseous and CP at this time.

## 2022-11-23 NOTE — ED Notes (Signed)
ED TO INPATIENT HANDOFF REPORT  ED Nurse Name and Phone #: [phone]  S Name/Age/Gender Herbert Moors 47 y.o. male Room/Bed: 030C/030C  Code Status   Code Status: DNR  Home/SNF/Other Home Patient oriented to: self, place, time, and situation Is this baseline? Yes   Triage Complete: Triage complete  Chief Complaint Alcohol withdrawal (HCC) [F10.939]  Triage Note Pt has been dizzy for several days and had multiple falls.  States he hit head yesterday and is on Eliquis   Allergies Allergies  Allergen Reactions   Doxycycline Rash    Level of Care/Admitting Diagnosis ED Disposition     ED Disposition  Admit   Condition  --   Comment  Hospital Area: [facility] [[id number]]  Level of Care: Progressive [102]  Admit to Progressive based on following criteria: ACUTE MENTAL DISORDER-RELATED Drug/Alcohol Ingestion/Overdose/Withdrawal, Suicidal Ideation/attempt requiring safety sitter and < Q2h monitoring/assessments, moderate to severe agitation that is managed with medication/sitter, CIWA-Ar score < 20.  May admit patient to [facility] or [facility] if equivalent level of care is available:: No  Covid Evaluation: Symptomatic Person Under Investigation (PUI) or recent exposure (last 10 days) *Testing Required*  Diagnosis: Alcohol withdrawal (HCC) [291.81.ICD-9-CM]  Admitting Physician: HATCHER, JEFFREY C [[id number]]  Attending Physician: Ninetta Lights, JEFFREY C [[id number]]  Certification:: I certify this patient will need inpatient services for at least 2 midnights  Estimated Length of Stay: 2          B Medical/Surgery History Past Medical History:  Diagnosis Date   Back pain    Depression with anxiety    Eczema    Hypertension    PTSD (post-traumatic stress disorder)    Seizures (HCC)    Past Surgical History:  Procedure Laterality Date   KNEE SURGERY Left      A IV Location/Drains/Wounds Patient Lines/Drains/Airways Status     Active  Line/Drains/Airways     Name Placement date Placement time Site Days   Peripheral IV 11/22/22 20 G 1" Anterior;Distal;Right Forearm 11/22/22  0710  Forearm  1   Peripheral IV 11/23/22 22 G 1" Anterior;Proximal;Right Forearm 11/23/22  0641  Forearm  less than 1            Intake/Output Last 24 hours  Intake/Output Summary (Last 24 hours) at 11/23/2022 1331 Last data filed at 11/23/2022 7494 Gross per 24 hour  Intake 478.85 ml  Output 700 ml  Net -221.15 ml    Labs/Imaging Results for orders placed or performed during the hospital encounter of 11/22/22 (from the past 48 hour(s))  Protime-INR     Status: None   Collection Time: 11/22/22  6:45 AM  Result Value Ref Range   Prothrombin Time 13.2 11.4 - 15.2 seconds   INR 1.0 0.8 - 1.2    Comment: (NOTE) INR goal varies based on device and disease states. Performed at [facility], [address], [place], [place] [address]   CBC with Differential     Status: Abnormal   Collection Time: 11/22/22  6:45 AM  Result Value Ref Range   WBC 7.1 4.0 - 10.5 K/uL   RBC 3.22 (L) 4.22 - 5.81 MIL/uL   Hemoglobin 11.7 (L) 13.0 - 17.0 g/dL   HCT 91.6 (L) 38.4 - 66.5 %   MCV 99.4 80.0 - 100.0 fL   MCH 36.3 (H) 26.0 - 34.0 pg   MCHC 36.6 (H) 30.0 - 36.0 g/dL   RDW 99.3 57.0 - 17.7 %   Platelets 169  150 - 400 K/uL   nRBC 1.1 (H) 0.0 - 0.2 %   Neutrophils Relative % 77 %   Neutro Abs 5.5 1.7 - 7.7 K/uL   Lymphocytes Relative 13 %   Lymphs Abs 0.9 0.7 - 4.0 K/uL   Monocytes Relative 9 %   Monocytes Absolute 0.6 0.1 - 1.0 K/uL   Eosinophils Relative 0 %   Eosinophils Absolute 0.0 0.0 - 0.5 K/uL   Basophils Relative 0 %   Basophils Absolute 0.0 0.0 - 0.1 K/uL   Immature Granulocytes 1 %   Abs Immature Granulocytes 0.05 0.00 - 0.07 K/uL    Comment: Performed at [facility] [address], [place], [place] [address]  ABO/Rh     Status: None   Collection Time: 11/22/22  6:45 AM  Result Value Ref Range   ABO/RH(D)      AB  POS Performed at [facility] [address], [place], [place] [address]   Resp panel by RT-PCR (RSV, Flu A&B, Covid) Anterior Nasal Swab     Status: None   Collection Time: 11/22/22  7:16 AM   Specimen: Anterior Nasal Swab  Result Value Ref Range   SARS Coronavirus 2 by RT PCR NEGATIVE NEGATIVE    Comment: (NOTE) SARS-CoV-2 target nucleic acids are NOT DETECTED.  The SARS-CoV-2 RNA is generally detectable in upper respiratory specimens during the acute phase of infection. The lowest concentration of SARS-CoV-2 viral copies this assay can detect is 138 copies/mL. A negative result does not preclude SARS-Cov-2 infection and should not be used as the sole basis for treatment or other patient management decisions. A negative result may occur with  improper specimen collection/handling, submission of specimen other than nasopharyngeal swab, presence of viral mutation(s) within the areas targeted by this assay, and inadequate number of viral copies(<138 copies/mL). A negative result must be combined with clinical observations, patient history, and epidemiological information. The expected result is Negative.  Fact Sheet for Patients:  EntrepreneurPulse.com.au  Fact Sheet for Healthcare Providers:  IncredibleEmployment.be  This test is no t yet approved or cleared by the [place] FDA and  has been authorized for detection and/or diagnosis of SARS-CoV-2 by FDA under an Emergency Use Authorization (EUA). This EUA will remain  in effect (meaning this test can be used) for the duration of the COVID-19 declaration under Section 564(b)(1) of the Act, 21 U.S.C.section 360bbb-3(b)(1), unless the authorization is terminated  or revoked sooner.       Influenza A by PCR NEGATIVE NEGATIVE   Influenza B by PCR NEGATIVE NEGATIVE    Comment: (NOTE) The Xpert Xpress SARS-CoV-2/FLU/RSV plus assay is intended as an aid in the diagnosis of influenza  from Nasopharyngeal swab specimens and should not be used as a sole basis for treatment. Nasal washings and aspirates are unacceptable for Xpert Xpress SARS-CoV-2/FLU/RSV testing.  Fact Sheet for Patients: EntrepreneurPulse.com.au  Fact Sheet for Healthcare Providers: IncredibleEmployment.be  This test is not yet approved or cleared by the [place] FDA and has been authorized for detection and/or diagnosis of SARS-CoV-2 by FDA under an Emergency Use Authorization (EUA). This EUA will remain in effect (meaning this test can be used) for the duration of the COVID-19 declaration under Section 564(b)(1) of the Act, 21 U.S.C. section 360bbb-3(b)(1), unless the authorization is terminated or revoked.     Resp Syncytial Virus by PCR NEGATIVE NEGATIVE    Comment: (NOTE) Fact Sheet for Patients: EntrepreneurPulse.com.au  Fact Sheet for Healthcare Providers: IncredibleEmployment.be  This test is not yet approved or cleared by the [place] and has been authorized for detection and/or diagnosis of SARS-CoV-2 by FDA under an Emergency Use Authorization (EUA). This EUA will remain in effect (meaning this test can be used) for the duration of the COVID-19 declaration under Section 564(b)(1) of the Act, 21 U.S.C. section 360bbb-3(b)(1), unless the authorization is terminated or revoked.  Performed at [facility], [address], [place], [place] [address]   I-stat chem 8, ED     Status: Abnormal   Collection Time: 11/22/22  7:31 AM  Result Value Ref Range   Sodium 134 (L) 135 - 145 mmol/L   Potassium 3.5 3.5 - 5.1 mmol/L   Chloride 93 (L) 98 - 111 mmol/L   BUN 43 (H) 6 - 20 mg/dL   Creatinine, Ser 5.17 (H) 0.61 - 1.24 mg/dL   Glucose, Bld 82 70 - 99 mg/dL    Comment: Glucose reference range applies only to samples taken after fasting for at least 8 hours.   Calcium, Ion 0.92 (L) 1.15 - 1.40  mmol/L   TCO2 27 22 - 32 mmol/L   Hemoglobin 13.3 13.0 - 17.0 g/dL   HCT 61.6 07.3 - 71.0 %  Type and screen [facility]     Status: None   Collection Time: 11/22/22  8:00 AM  Result Value Ref Range   ABO/RH(D) AB POS    Antibody Screen NEG    Sample Expiration      11/25/2022,2359 Performed at [facility], [address], [place], [place] [address]   Lactic acid, plasma     Status: Abnormal   Collection Time: 11/22/22  8:00 AM  Result Value Ref Range   Lactic Acid, Venous 5.0 (HH) 0.5 - 1.9 mmol/L    Comment: CRITICAL RESULT CALLED TO, READ BACK BY AND VERIFIED WITH YANG,C RN @ [phone] 11/22/22 LEONARD,A Performed at [facility], [address], [place], [place] [address]   Ethanol     Status: Abnormal   Collection Time: 11/22/22  8:00 AM  Result Value Ref Range   Alcohol, Ethyl (B) 100 (H) <10 mg/dL    Comment: (NOTE) Lowest detectable limit for serum alcohol is 10 mg/dL.  For medical purposes only. Performed at [facility], [address] [place], [place] [address]   POC occult blood, ED     Status: Abnormal   Collection Time: 11/22/22  8:38 AM  Result Value Ref Range   Fecal Occult Bld POSITIVE (A) NEGATIVE  Troponin I (High Sensitivity)     Status: Abnormal   Collection Time: 11/22/22  8:55 AM  Result Value Ref Range   Troponin I (High Sensitivity) 21 (H) <18 ng/L    Comment: (NOTE) Elevated high sensitivity troponin I (hsTnI) values and significant  changes across serial measurements may suggest ACS but many other  chronic and acute conditions are known to elevate hsTnI results.  Refer to the "Links" section for chest pain algorithms and additional  guidance. Performed at [facility], [address], [place], [place] [address]   Comprehensive metabolic panel     Status: Abnormal   Collection Time: 11/22/22  8:55 AM  Result Value Ref Range   Sodium 136 135 - 145 mmol/L   Potassium 3.0 (L) 3.5 - 5.1 mmol/L   Chloride  93 (L) 98 - 111 mmol/L   CO2 20 (L) 22 - 32 mmol/L   Glucose,  Bld 70 70 - 99 mg/dL    Comment: Glucose reference range applies only to samples taken after fasting for at least 8 hours.   BUN 33 (H) 6 - 20 mg/dL   Creatinine, Ser 4.40 0.61 - 1.24 mg/dL   Calcium 7.8 (L) 8.9 - 10.3 mg/dL   Total Protein 6.5 6.5 - 8.1 g/dL   Albumin 2.6 (L) 3.5 - 5.0 g/dL   AST 102 (H) 15 - 41 U/L   ALT 86 (H) 0 - 44 U/L   Alkaline Phosphatase 224 (H) 38 - 126 U/L   Total Bilirubin 1.6 (H) 0.3 - 1.2 mg/dL   GFR, Estimated >72 >53 mL/min    Comment: (NOTE) Calculated using the CKD-EPI Creatinine Equation (2021)    Anion gap 23 (H) 5 - 15    Comment: ELECTROLYTES REPEATED TO VERIFY Performed at [facility], [address], [place], [place] [address]   Troponin I (High Sensitivity)     Status: Abnormal   Collection Time: 11/22/22  8:55 AM  Result Value Ref Range   Troponin I (High Sensitivity) 31 (H) <18 ng/L    Comment: (NOTE) Elevated high sensitivity troponin I (hsTnI) values and significant  changes across serial measurements may suggest ACS but many other  chronic and acute conditions are known to elevate hsTnI results.  Refer to the "Links" section for chest pain algorithms and additional  guidance. Performed at [facility], [address], [place], [place] [address]   Lipase, blood     Status: None   Collection Time: 11/22/22  8:55 AM  Result Value Ref Range   Lipase 40 11 - 51 U/L    Comment: Performed at [facility], [address], [place], [place] [address]  Lactic acid, plasma     Status: Abnormal   Collection Time: 11/22/22 10:10 AM  Result Value Ref Range   Lactic Acid, Venous 5.5 (HH) 0.5 - 1.9 mmol/L    Comment: CRITICAL VALUE NOTED. VALUE IS CONSISTENT WITH PREVIOUSLY REPORTED/CALLED VALUE Performed at [facility], [address] [place], [place] [address]   Urinalysis, Routine w reflex microscopic -     Status: Abnormal   Collection Time:  11/22/22  2:05 PM  Result Value Ref Range   Color, Urine Detrell Umscheid (A) YELLOW    Comment: BIOCHEMICALS MAY BE AFFECTED BY COLOR   APPearance HAZY (A) CLEAR   Specific Gravity, Urine 1.019 1.005 - 1.030   pH 5.0 5.0 - 8.0   Glucose, UA NEGATIVE NEGATIVE mg/dL   Hgb urine dipstick NEGATIVE NEGATIVE   Bilirubin Urine NEGATIVE NEGATIVE   Ketones, ur 20 (A) NEGATIVE mg/dL   Protein, ur 30 (A) NEGATIVE mg/dL   Nitrite NEGATIVE NEGATIVE   Leukocytes,Ua SMALL (A) NEGATIVE   RBC / HPF 0-5 0 - 5 RBC/hpf   WBC, UA 11-20 0 - 5 WBC/hpf   Bacteria, UA RARE (A) NONE SEEN   Squamous Epithelial / HPF 0-5 0 - 5 /HPF   WBC Clumps PRESENT    Mucus PRESENT    Hyaline Casts, UA PRESENT     Comment: Performed at [facility], [address], [place], [place] [address]  CBG monitoring, ED     Status: Abnormal   Collection Time: 11/22/22  2:30 PM  Result Value Ref Range   Glucose-Capillary 132 (H) 70 - 99 mg/dL    Comment: Glucose reference range applies only to samples taken after fasting for at least  8 hours.  Basic metabolic panel     Status: Abnormal   Collection Time: 11/22/22  2:34 PM  Result Value Ref Range   Sodium 137 135 - 145 mmol/L   Potassium 3.6 3.5 - 5.1 mmol/L    Comment: HEMOLYSIS AT THIS LEVEL MAY AFFECT RESULT   Chloride 100 98 - 111 mmol/L   CO2 24 22 - 32 mmol/L   Glucose, Bld 112 (H) 70 - 99 mg/dL    Comment: Glucose reference range applies only to samples taken after fasting for at least 8 hours.   BUN 26 (H) 6 - 20 mg/dL   Creatinine, Ser 5.05 0.61 - 1.24 mg/dL   Calcium 7.2 (L) 8.9 - 10.3 mg/dL   GFR, Estimated >39 >76 mL/min    Comment: (NOTE) Calculated using the CKD-EPI Creatinine Equation (2021)    Anion gap 13 5 - 15    Comment: Performed at [facility], [address], [place], [place] [address]  Lactic acid, plasma     Status: Abnormal   Collection Time: 11/22/22  2:34 PM  Result Value Ref Range   Lactic Acid, Venous 4.3 (HH) 0.5 - 1.9 mmol/L     Comment: CRITICAL VALUE NOTED. VALUE IS CONSISTENT WITH PREVIOUSLY REPORTED/CALLED VALUE Performed at [facility], [address] [place], [place] [address]   Lactic acid, plasma     Status: Abnormal   Collection Time: 11/22/22  4:44 PM  Result Value Ref Range   Lactic Acid, Venous 2.6 (HH) 0.5 - 1.9 mmol/L    Comment: CRITICAL VALUE NOTED. VALUE IS CONSISTENT WITH PREVIOUSLY REPORTED/CALLED VALUE Performed at [facility], [address], [place], [place] [address]   CBG monitoring, ED     Status: None   Collection Time: 11/22/22  4:47 PM  Result Value Ref Range   Glucose-Capillary 78 70 - 99 mg/dL    Comment: Glucose reference range applies only to samples taken after fasting for at least 8 hours.  Respiratory (~20 pathogens) panel by PCR     Status: None   Collection Time: 11/22/22  4:50 PM   Specimen: Nasopharyngeal Swab; Respiratory  Result Value Ref Range   Adenovirus NOT DETECTED NOT DETECTED   Coronavirus 229E NOT DETECTED NOT DETECTED    Comment: (NOTE) The Coronavirus on the Respiratory Panel, DOES NOT test for the novel  Coronavirus (2019 nCoV)    Coronavirus HKU1 NOT DETECTED NOT DETECTED   Coronavirus NL63 NOT DETECTED NOT DETECTED   Coronavirus OC43 NOT DETECTED NOT DETECTED   Metapneumovirus NOT DETECTED NOT DETECTED   Rhinovirus / Enterovirus NOT DETECTED NOT DETECTED   Influenza A NOT DETECTED NOT DETECTED   Influenza B NOT DETECTED NOT DETECTED   Parainfluenza Virus 1 NOT DETECTED NOT DETECTED   Parainfluenza Virus 2 NOT DETECTED NOT DETECTED   Parainfluenza Virus 3 NOT DETECTED NOT DETECTED   Parainfluenza Virus 4 NOT DETECTED NOT DETECTED   Respiratory Syncytial Virus NOT DETECTED NOT DETECTED   Bordetella pertussis NOT DETECTED NOT DETECTED   Bordetella Parapertussis NOT DETECTED NOT DETECTED   Chlamydophila pneumoniae NOT DETECTED NOT DETECTED   Mycoplasma pneumoniae NOT DETECTED NOT DETECTED    Comment: Performed at [facility]  [facility], [address] [place], [place] [address]  Basic metabolic panel     Status: Abnormal   Collection Time: 11/22/22  6:30 PM  Result Value Ref Range   Sodium 134 (L) 135 - 145 mmol/L   Potassium 2.9 (  L) 3.5 - 5.1 mmol/L   Chloride 99 98 - 111 mmol/L   CO2 25 22 - 32 mmol/L   Glucose, Bld 154 (H) 70 - 99 mg/dL    Comment: Glucose reference range applies only to samples taken after fasting for at least 8 hours.   BUN 24 (H) 6 - 20 mg/dL   Creatinine, Ser 1.61 0.61 - 1.24 mg/dL   Calcium 7.5 (L) 8.9 - 10.3 mg/dL   GFR, Estimated >09 >60 mL/min    Comment: (NOTE) Calculated using the CKD-EPI Creatinine Equation (2021)    Anion gap 10 5 - 15    Comment: Performed at [facility], [address], [place], [place] [address]  RPR     Status: None   Collection Time: 11/22/22  6:30 PM  Result Value Ref Range   RPR Ser Ql NON REACTIVE NON REACTIVE    Comment: Performed at [facility], [address] [place], [place] [address]  Hepatitis panel, acute     Status: None   Collection Time: 11/22/22  6:30 PM  Result Value Ref Range   Hepatitis B Surface Ag NON REACTIVE NON REACTIVE   HCV Ab NON REACTIVE NON REACTIVE    Comment: (NOTE) Nonreactive HCV antibody screen is consistent with no HCV infections,  unless recent infection is suspected or other evidence exists to indicate HCV infection.     Hep A IgM NON REACTIVE NON REACTIVE   Hep B C IgM NON REACTIVE NON REACTIVE    Comment: Performed at [facility], [address] [place], [place] [address]  CBG monitoring, ED     Status: Abnormal   Collection Time: 11/22/22  8:02 PM  Result Value Ref Range   Glucose-Capillary 147 (H) 70 - 99 mg/dL    Comment: Glucose reference range applies only to samples taken after fasting for at least 8 hours.  CBG monitoring, ED     Status: Abnormal   Collection Time: 11/22/22  9:26 PM  Result Value Ref Range   Glucose-Capillary 146 (H) 70 - 99 mg/dL    Comment: Glucose reference range  applies only to samples taken after fasting for at least 8 hours.  CBG monitoring, ED     Status: Abnormal   Collection Time: 11/23/22  1:05 AM  Result Value Ref Range   Glucose-Capillary 237 (H) 70 - 99 mg/dL    Comment: Glucose reference range applies only to samples taken after fasting for at least 8 hours.  Basic metabolic panel     Status: Abnormal   Collection Time: 11/23/22  2:32 AM  Result Value Ref Range   Sodium 135 135 - 145 mmol/L   Potassium 2.8 (L) 3.5 - 5.1 mmol/L   Chloride 100 98 - 111 mmol/L   CO2 29 22 - 32 mmol/L   Glucose, Bld 168 (H) 70 - 99 mg/dL    Comment: Glucose reference range applies only to samples taken after fasting for at least 8 hours.   BUN 18 6 - 20 mg/dL   Creatinine, Ser 5.62 0.61 - 1.24 mg/dL   Calcium 7.5 (L) 8.9 - 10.3 mg/dL   GFR, Estimated >13 >08 mL/min    Comment: (NOTE) Calculated using the CKD-EPI Creatinine Equation (2021)    Anion gap 6 5 - 15    Comment: Performed at [facility], [address] [place], [place] [address]  Magnesium     Status: None   Collection Time: 11/23/22  2:32 AM  Result Value Ref Range   Magnesium 1.8 1.7 - 2.4 mg/dL    Comment: Performed at [facility], [address] [place], [place] [address]  Phosphorus     Status: Abnormal   Collection Time: 11/23/22  2:32 AM  Result Value Ref Range   Phosphorus 1.1 (L) 2.5 - 4.6 mg/dL    Comment: Performed at [facility], [address] [place], [place] [address]  CBC     Status: Abnormal   Collection Time: 11/23/22  2:32 AM  Result Value Ref Range   WBC 7.8 4.0 - 10.5 K/uL   RBC 2.29 (L) 4.22 - 5.81 MIL/uL   Hemoglobin 8.4 (L) 13.0 - 17.0 g/dL    Comment: REPEATED TO VERIFY   HCT 23.5 (L) 39.0 - 52.0 %   MCV 102.6 (H) 80.0 - 100.0 fL   MCH 36.7 (H) 26.0 - 34.0 pg   MCHC 35.7 30.0 - 36.0 g/dL   RDW 09.814.5 11.911.5 - 14.715.5 %   Platelets 128 (L) 150 - 400 K/uL   nRBC 1.7 (H) 0.0 - 0.2 %    Comment: Performed at [facility], [address] [place], [place] [address]  Hepatic function panel     Status: Abnormal   Collection Time: 11/23/22  2:32 AM  Result Value Ref Range   Total Protein 5.0 (L) 6.5 - 8.1 g/dL   Albumin 1.9 (L) 3.5 - 5.0 g/dL   AST 213125 (H) 15 - 41 U/L   ALT 59 (H) 0 - 44 U/L   Alkaline Phosphatase 178 (H) 38 - 126 U/L   Total Bilirubin 1.1 0.3 - 1.2 mg/dL   Bilirubin, Direct 0.3 (H) 0.0 - 0.2 mg/dL   Indirect Bilirubin 0.8 0.3 - 0.9 mg/dL    Comment: Performed at [facility], [address] [place], [place] [address]  CBG monitoring, ED     Status: None   Collection Time: 11/23/22  4:31 AM  Result Value Ref Range   Glucose-Capillary 83 70 - 99 mg/dL    Comment: Glucose reference range applies only to samples taken after fasting for at least 8 hours.   Comment 1 Notify RN    Comment 2 Document in Chart   CBG monitoring, ED     Status: Abnormal   Collection Time: 11/23/22  8:01 AM  Result Value Ref Range   Glucose-Capillary 172 (H) 70 - 99 mg/dL    Comment: Glucose reference range applies only to samples taken after fasting for at least 8 hours.  CBG monitoring, ED     Status: Abnormal   Collection Time: 11/23/22  9:58 AM  Result Value Ref Range   Glucose-Capillary 133 (H) 70 - 99 mg/dL    Comment: Glucose reference range applies only to samples taken after fasting for at least 8 hours.   DG Pelvis 1-2 Views  Result Date: 11/22/2022 CLINICAL DATA:  Fall pain EXAM: PELVIS - 1 VIEW; LEFT FEMUR 2 VIEWS COMPARISON:  09/10/2022 FINDINGS: No fracture, dislocation or subluxation identified. Heterotopic ossification about the left hip is consistent with old trauma. No osteolytic or osteoblastic changes. IMPRESSION: No acute osseous abnormalities. Electronically Signed   By: Layla MawJoshua  Pleasure M.D.   On: 11/22/2022 08:44   DG Femur Min 2 Views Left  Result Date: 11/22/2022 CLINICAL DATA:  Fall pain EXAM: PELVIS - 1 VIEW; LEFT FEMUR 2 VIEWS COMPARISON:  09/10/2022 FINDINGS: No fracture, dislocation or  subluxation identified.  Heterotopic ossification about the left hip is consistent with old trauma. No osteolytic or osteoblastic changes. IMPRESSION: No acute osseous abnormalities. Electronically Signed   By: Sammie Bench M.D.   On: 11/22/2022 08:44   CT Maxillofacial WO CM  Result Date: 11/22/2022 CLINICAL DATA:  47 year old male with dizziness and fall. EXAM: CT MAXILLOFACIAL WITHOUT CONTRAST TECHNIQUE: Multidetector CT imaging of the maxillofacial structures was performed. Multiplanar CT image reconstructions were also generated. RADIATION DOSE REDUCTION: This exam was performed according to the departmental dose-optimization program which includes automated exposure control, adjustment of the mA and/or kV according to patient size and/or use of iterative reconstruction technique. COMPARISON:  Head CT today. FINDINGS: Osseous: Mandible intact and normally located. Crowding of mandible incisor dentition. No acute dental finding identified. Maxilla, zygoma, pterygoid, and nasal bones appear intact. Central skull base appears intact. Visible cervical vertebrae appear intact and aligned. Orbits: Intact orbital walls. Orbits soft tissues appears symmetric and normal. Sinuses: Clear throughout. Soft tissues: Negative visible noncontrast supraglottic larynx, pharynx, parapharyngeal spaces, retropharyngeal space, sublingual space, submandibular spaces, masticator and parotid spaces. No superficial soft tissue injury identified. No upper cervical lymphadenopathy. Limited intracranial: Stable to that reported separately today. IMPRESSION: No acute traumatic injury identified in the Face. Electronically Signed   By: Genevie Ann M.D.   On: 11/22/2022 08:03   CT Head Wo Contrast  Result Date: 11/22/2022 CLINICAL DATA:  46 year old male with dizziness and fall. History of atrial fibrillation and right basal ganglia infarct in 2021. EXAM: CT HEAD WITHOUT CONTRAST TECHNIQUE: Contiguous axial images were obtained from  the base of the skull through the vertex without intravenous contrast. RADIATION DOSE REDUCTION: This exam was performed according to the departmental dose-optimization program which includes automated exposure control, adjustment of the mA and/or kV according to patient size and/or use of iterative reconstruction technique. COMPARISON:  Head CT 05/23/2020.  CTA head and neck 05/25/2020. FINDINGS: Brain: Stable cerebral volume. No midline shift, ventriculomegaly, mass effect, evidence of mass lesion, intracranial hemorrhage or evidence of cortically based acute infarction. Subtle encephalomalacia in the right lentiform corresponding to 2021 ischemia. Gray-white matter differentiation elsewhere appears stable and within normal limits. Vascular: No suspicious intracranial vascular hyperdensity. Skull: No acute osseous abnormality identified. Sinuses/Orbits: Visualized paranasal sinuses and mastoids are stable and well aerated. Other: No acute orbit or scalp soft tissue injury identified. IMPRESSION: 1. No acute intracranial abnormality or acute traumatic injury identified. 2. Subtle encephalomalacia in the right lentiform corresponding to 2021 ischemia. Electronically Signed   By: Genevie Ann M.D.   On: 11/22/2022 07:59   DG Chest Port 1 View  Result Date: 11/22/2022 CLINICAL DATA:  Weakness and dizziness.  Frequent falls. EXAM: PORTABLE CHEST 1 VIEW COMPARISON:  Portable chest 06/14/2022. FINDINGS: The cardiomediastinal silhouette and vascular pattern are normal. The lungs are mildly emphysematous but clear. No pleural effusion is seen. No acute osseous findings or visible displaced rib fractures. There are multiple overlying monitor wires. IMPRESSION: No acute radiographic chest findings.  Stable COPD chest. Electronically Signed   By: Telford Nab M.D.   On: 11/22/2022 07:49    Pending Labs Unresulted Labs (From admission, onward)     Start     Ordered   11/24/22 0500  CBC  Tomorrow morning,   R         11/23/22 1131   11/24/22 0500  Comprehensive metabolic panel  Tomorrow morning,   R        11/23/22 1131   11/23/22 1200  CBC  Once,   R        11/23/22 0606   11/23/22 0300  HIV Antibody (routine testing w rflx)  Once,   R        11/23/22 0300            Vitals/Pain Today's Vitals   11/23/22 0900 11/23/22 1245 11/23/22 1300 11/23/22 1315  BP: 112/74 111/88 112/82 109/80  Pulse: (!) 101 (!) 113 (!) 103 (!) 115  Resp: 17 16 17 17   Temp:      TempSrc:      SpO2: 100% 100% 100% 100%  Weight:      Height:      PainSc:        Isolation Precautions Droplet precaution  Medications Medications  folic acid (FOLVITE) tablet 1 mg (1 mg Oral Given 11/23/22 0937)  multivitamin with minerals tablet 1 tablet (1 tablet Oral Given 11/23/22 0937)  ondansetron (ZOFRAN) injection 4 mg (4 mg Intravenous Given 11/23/22 0606)  insulin aspart (novoLOG) injection 0-15 Units (2 Units Subcutaneous Given 11/23/22 1003)  pantoprazole (PROTONIX) EC tablet 40 mg (40 mg Oral Given 11/23/22 0937)  nicotine (NICODERM CQ - dosed in mg/24 hours) patch 14 mg (14 mg Transdermal Patient Refused/Not Given 11/23/22 1000)  potassium chloride 10 mEq in 100 mL IVPB (10 mEq Intravenous New Bag/Given 11/23/22 1157)  potassium PHOSPHATE 30 mmol in dextrose 5 % 500 mL infusion (has no administration in time range)  chlordiazePOXIDE (LIBRIUM) capsule 25 mg (has no administration in time range)  hydrOXYzine (ATARAX) tablet 25 mg (has no administration in time range)  loperamide (IMODIUM) capsule 2-4 mg (has no administration in time range)  chlordiazePOXIDE (LIBRIUM) capsule 25 mg (25 mg Oral Given 11/23/22 1150)    Followed by  chlordiazePOXIDE (LIBRIUM) capsule 25 mg (has no administration in time range)    Followed by  chlordiazePOXIDE (LIBRIUM) capsule 25 mg (has no administration in time range)    Followed by  chlordiazePOXIDE (LIBRIUM) capsule 25 mg (has no administration in time range)  sodium chloride 0.9 % bolus  1,000 mL (0 mLs Intravenous Stopped 11/22/22 1143)  pantoprazole (PROTONIX) 80 mg /NS 100 mL IVPB (0 mg Intravenous Stopped 11/22/22 1058)  LORazepam (ATIVAN) injection 0.5 mg (0.5 mg Intravenous Given 11/22/22 1017)  LORazepam (ATIVAN) injection 1 mg (1 mg Intravenous Given 11/22/22 1017)  sodium chloride 0.9 % bolus 1,000 mL (0 mLs Intravenous Stopped 11/22/22 1058)  potassium chloride 10 mEq in 100 mL IVPB (0 mEq Intravenous Stopped 11/22/22 1323)  lactated ringers bolus 1,000 mL (0 mLs Intravenous Stopped 11/22/22 1322)  magnesium sulfate IVPB 2 g 50 mL (0 g Intravenous Stopped 11/23/22 0920)  thiamine (VITAMIN B1) injection 100 mg (100 mg Intramuscular Given 11/23/22 1237)    Mobility walks     Focused Assessments    R Recommendations: See Admitting Provider Note  Report given to:   Additional Notes:

## 2022-11-23 NOTE — Progress Notes (Signed)
HD#1 Subjective:   Summary: Bern Fare is a 47 y/o male with PMH of AUD, prior DTs who presented with nausea, vomiting, 2x hematemesis, tremors, and cough and is admitted for alcohol withdrawal.   Overnight Events: CIWA scores overnight ranged from 8-11 and he received 6 mg of Ativan from 8 PM to 8 AM.  This morning patient is still disoriented and groggy but he did get an Ativan dose about 40 minutes prior to seeing him.  He denies any new or worsening issues and has not noticed any dark or bloody bowel movements or had any more vomiting.  No belly pain.  Objective:  Vital signs in last 24 hours: Vitals:   11/23/22 0545 11/23/22 0600 11/23/22 0603 11/23/22 0605  BP: (!) 121/38 115/88  115/88  Pulse: (!) 108 (!) 111  (!) 108  Resp:      Temp:   98.9 F (37.2 C)   TempSrc:   Oral   SpO2: 100% 100%    Weight:      Height:       Supplemental O2: Room Air SpO2: 100 %   Physical Exam:  Constitutional: Ill-appearing middle-age male, in no acute distress Cardiovascular: Tachycardic with regular rhythm Pulmonary/Chest: normal work of breathing on room air, lungs clear to auscultation bilaterally Abdominal: soft, non-tender, non-distended MSK: normal bulk and tone Neurological: alert & oriented to person only, moves all extremities well against gravity Skin: warm and dry  Filed Weights   11/22/22 0627  Weight: 79.1 kg     Intake/Output Summary (Last 24 hours) at 11/23/2022 3016 Last data filed at 11/22/2022 1901 Gross per 24 hour  Intake 2576.14 ml  Output --  Net 2576.14 ml   Net IO Since Admission: 2,576.14 mL [11/23/22 0608]  Pertinent Labs:    Latest Ref Rng & Units 11/23/2022    2:32 AM 11/22/2022    7:31 AM 11/22/2022    6:45 AM  CBC  WBC 4.0 - 10.5 K/uL 7.8   7.1   Hemoglobin 13.0 - 17.0 g/dL 8.4  13.3  11.7   Hematocrit 39.0 - 52.0 % 23.5  39.0  32.0   Platelets 150 - 400 K/uL 128   169        Latest Ref Rng & Units 11/23/2022    2:32 AM 11/22/2022     6:30 PM 11/22/2022    2:34 PM  CMP  Glucose 70 - 99 mg/dL 168  154  112   BUN 6 - 20 mg/dL 18  24  26    Creatinine 0.61 - 1.24 mg/dL 0.74  0.84  1.07   Sodium 135 - 145 mmol/L 135  134  137   Potassium 3.5 - 5.1 mmol/L 2.8  2.9  3.6   Chloride 98 - 111 mmol/L 100  99  100   CO2 22 - 32 mmol/L 29  25  24    Calcium 8.9 - 10.3 mg/dL 7.5  7.5  7.2   Total Protein 6.5 - 8.1 g/dL 5.0     Total Bilirubin 0.3 - 1.2 mg/dL 1.1     Alkaline Phos 38 - 126 U/L 178     AST 15 - 41 U/L 125     ALT 0 - 44 U/L 59       Imaging: DG Pelvis 1-2 Views  Result Date: 11/22/2022 CLINICAL DATA:  Fall pain EXAM: PELVIS - 1 VIEW; LEFT FEMUR 2 VIEWS COMPARISON:  09/10/2022 FINDINGS: No fracture, dislocation or subluxation identified. Heterotopic ossification  about the left hip is consistent with old trauma. No osteolytic or osteoblastic changes. IMPRESSION: No acute osseous abnormalities. Electronically Signed   By: Sammie Bench M.D.   On: 11/22/2022 08:44   DG Femur Min 2 Views Left  Result Date: 11/22/2022 CLINICAL DATA:  Fall pain EXAM: PELVIS - 1 VIEW; LEFT FEMUR 2 VIEWS COMPARISON:  09/10/2022 FINDINGS: No fracture, dislocation or subluxation identified. Heterotopic ossification about the left hip is consistent with old trauma. No osteolytic or osteoblastic changes. IMPRESSION: No acute osseous abnormalities. Electronically Signed   By: Sammie Bench M.D.   On: 11/22/2022 08:44   CT Maxillofacial WO CM  Result Date: 11/22/2022 CLINICAL DATA:  47 year old male with dizziness and fall. EXAM: CT MAXILLOFACIAL WITHOUT CONTRAST TECHNIQUE: Multidetector CT imaging of the maxillofacial structures was performed. Multiplanar CT image reconstructions were also generated. RADIATION DOSE REDUCTION: This exam was performed according to the departmental dose-optimization program which includes automated exposure control, adjustment of the mA and/or kV according to patient size and/or use of iterative  reconstruction technique. COMPARISON:  Head CT today. FINDINGS: Osseous: Mandible intact and normally located. Crowding of mandible incisor dentition. No acute dental finding identified. Maxilla, zygoma, pterygoid, and nasal bones appear intact. Central skull base appears intact. Visible cervical vertebrae appear intact and aligned. Orbits: Intact orbital walls. Orbits soft tissues appears symmetric and normal. Sinuses: Clear throughout. Soft tissues: Negative visible noncontrast supraglottic larynx, pharynx, parapharyngeal spaces, retropharyngeal space, sublingual space, submandibular spaces, masticator and parotid spaces. No superficial soft tissue injury identified. No upper cervical lymphadenopathy. Limited intracranial: Stable to that reported separately today. IMPRESSION: No acute traumatic injury identified in the Face. Electronically Signed   By: Genevie Ann M.D.   On: 11/22/2022 08:03   CT Head Wo Contrast  Result Date: 11/22/2022 CLINICAL DATA:  47 year old male with dizziness and fall. History of atrial fibrillation and right basal ganglia infarct in 2021. EXAM: CT HEAD WITHOUT CONTRAST TECHNIQUE: Contiguous axial images were obtained from the base of the skull through the vertex without intravenous contrast. RADIATION DOSE REDUCTION: This exam was performed according to the departmental dose-optimization program which includes automated exposure control, adjustment of the mA and/or kV according to patient size and/or use of iterative reconstruction technique. COMPARISON:  Head CT 05/23/2020.  CTA head and neck 05/25/2020. FINDINGS: Brain: Stable cerebral volume. No midline shift, ventriculomegaly, mass effect, evidence of mass lesion, intracranial hemorrhage or evidence of cortically based acute infarction. Subtle encephalomalacia in the right lentiform corresponding to 2021 ischemia. Gray-white matter differentiation elsewhere appears stable and within normal limits. Vascular: No suspicious intracranial  vascular hyperdensity. Skull: No acute osseous abnormality identified. Sinuses/Orbits: Visualized paranasal sinuses and mastoids are stable and well aerated. Other: No acute orbit or scalp soft tissue injury identified. IMPRESSION: 1. No acute intracranial abnormality or acute traumatic injury identified. 2. Subtle encephalomalacia in the right lentiform corresponding to 2021 ischemia. Electronically Signed   By: Genevie Ann M.D.   On: 11/22/2022 07:59   DG Chest Port 1 View  Result Date: 11/22/2022 CLINICAL DATA:  Weakness and dizziness.  Frequent falls. EXAM: PORTABLE CHEST 1 VIEW COMPARISON:  Portable chest 06/14/2022. FINDINGS: The cardiomediastinal silhouette and vascular pattern are normal. The lungs are mildly emphysematous but clear. No pleural effusion is seen. No acute osseous findings or visible displaced rib fractures. There are multiple overlying monitor wires. IMPRESSION: No acute radiographic chest findings.  Stable COPD chest. Electronically Signed   By: Telford Nab M.D.   On: 11/22/2022 07:49  Assessment/Plan:   Principal Problem:   Alcohol withdrawal (HCC)   Patient Summary: CATO LIBURD is a 47 y.o. with a pertinent PMH of alcohol use disorder with previous delirium tremens who presented with nausea, vomiting, hematemesis, tremors, and cough and admitted for alcohol withdrawal.    Alcohol withdrawal Alcohol use disorder Elevated transaminases Patient is stable this morning but still disoriented and is having his high CIWA scores.  With his history of delirium tremens we will initiate a Librium taper. - Librium taper - Continue thiamine, folic acid, Zofran as needed  Macrocytic anemia Hematemesis Patient appears to have a history of normocytic anemia with baseline hemoglobin 11-12.  When he arrived his hemoglobin was within his baseline but this morning is down to 8.4.  Per nursing he did have some bowel movements overnight but they were not seen.  He has not had any  more hematemesis.  He has been tachycardic since admission and remains mildly tachycardic.  Blood pressures remained stable overall.  He is definitely at risk for GI bleed secondary to erosive gastritis or peptic ulcer but is not clear that there is an active or brisk GI bleed at this time.  We will check a CBC again today and if further decline or any increased frequency or dark/tarry stools we will consult GI. - Hold Eliquis - 12 PM CBC - Continue with clear liquid diet, may advance if afternoon CBC stable or improved and no other signs of active bleeding - Monitor for signs of hemodynamic instability  AKI Anion gap metabolic acidosis/lactic acidosis Hypokalemia Creatinine elevated at 1.30 on admission and quickly decreased after IV fluids to 0.74 today.  Anion gap has been closed and stable overnight.  Lactic acid is trending down as well. - Continue to monitor and ensure adequate p.o. intake - Monitor and replete potassium as needed  Dizziness Could be a component of or at least exacerbated by his alcohol use/withdrawal.  We will consider vestibular PT after acute withdrawal has resolved.  Paroxysmal atrial fibrillation On Eliquis at home which we are holding due to anemia described above.  Hypertension On clonidine at home which we are holding at the moment and will resume as necessary.  No rebound hypertension noted yet.  Tobacco use disorder - Nicotine patch  Diet:  Clear liquid VTE: None Code: DNR PT/OT recs: Pending TOC recs: Pending  Dispo: Anticipated discharge plan pending improvement of acute withdrawal.  Rocky Morel, DO Internal Medicine Resident PGY-1 Pager: [phone]  Please contact the on call pager after 5 pm and on weekends at [phone].

## 2022-11-24 ENCOUNTER — Other Ambulatory Visit (HOSPITAL_COMMUNITY): Payer: Self-pay

## 2022-11-24 DIAGNOSIS — F10939 Alcohol use, unspecified with withdrawal, unspecified: Secondary | ICD-10-CM | POA: Diagnosis not present

## 2022-11-24 LAB — GLUCOSE, CAPILLARY
Glucose-Capillary: 105 mg/dL — ABNORMAL HIGH (ref 70–99)
Glucose-Capillary: 125 mg/dL — ABNORMAL HIGH (ref 70–99)
Glucose-Capillary: 126 mg/dL — ABNORMAL HIGH (ref 70–99)
Glucose-Capillary: 132 mg/dL — ABNORMAL HIGH (ref 70–99)
Glucose-Capillary: 145 mg/dL — ABNORMAL HIGH (ref 70–99)
Glucose-Capillary: 176 mg/dL — ABNORMAL HIGH (ref 70–99)
Glucose-Capillary: 229 mg/dL — ABNORMAL HIGH (ref 70–99)

## 2022-11-24 LAB — COMPREHENSIVE METABOLIC PANEL
ALT: 57 U/L — ABNORMAL HIGH (ref 0–44)
AST: 82 U/L — ABNORMAL HIGH (ref 15–41)
Albumin: 2 g/dL — ABNORMAL LOW (ref 3.5–5.0)
Alkaline Phosphatase: 154 U/L — ABNORMAL HIGH (ref 38–126)
Anion gap: 8 (ref 5–15)
BUN: <5 mg/dL — ABNORMAL LOW (ref 6–20)
CO2: 29 mmol/L (ref 22–32)
Calcium: 7.5 mg/dL — ABNORMAL LOW (ref 8.9–10.3)
Chloride: 99 mmol/L (ref 98–111)
Creatinine, Ser: 0.62 mg/dL (ref 0.61–1.24)
GFR, Estimated: >60 mL/min (ref >60)
Glucose, Bld: 179 mg/dL — ABNORMAL HIGH (ref 70–99)
Potassium: 3.2 mmol/L — ABNORMAL LOW (ref 3.5–5.1)
Sodium: 136 mmol/L (ref 135–145)
Total Bilirubin: 0.9 mg/dL (ref 0.3–1.2)
Total Protein: 5 g/dL — ABNORMAL LOW (ref 6.5–8.1)

## 2022-11-24 LAB — CBC
HCT: 23.7 % — ABNORMAL LOW (ref 39.0–52.0)
Hemoglobin: 8.2 g/dL — ABNORMAL LOW (ref 13.0–17.0)
MCH: 36 pg — ABNORMAL HIGH (ref 26.0–34.0)
MCHC: 34.6 g/dL (ref 30.0–36.0)
MCV: 103.9 fL — ABNORMAL HIGH (ref 80.0–100.0)
Platelets: 131 10*3/uL — ABNORMAL LOW (ref 150–400)
RBC: 2.28 MIL/uL — ABNORMAL LOW (ref 4.22–5.81)
RDW: 13.9 % (ref 11.5–15.5)
WBC: 9.6 10*3/uL (ref 4.0–10.5)
nRBC: 0.8 % — ABNORMAL HIGH (ref 0.0–0.2)

## 2022-11-24 LAB — MAGNESIUM: Magnesium: 1.5 mg/dL — ABNORMAL LOW (ref 1.7–2.4)

## 2022-11-24 LAB — PHOSPHORUS: Phosphorus: 1.3 mg/dL — ABNORMAL LOW (ref 2.5–4.6)

## 2022-11-24 MED ORDER — MAGNESIUM SULFATE 2 GM/50ML IV SOLN
2.0000 g | Freq: Once | INTRAVENOUS | Status: AC
  Administered 2022-11-24: 2 g via INTRAVENOUS
  Filled 2022-11-24: qty 50

## 2022-11-24 MED ORDER — POTASSIUM PHOSPHATES 15 MMOLE/5ML IV SOLN
30.0000 mmol | Freq: Once | INTRAVENOUS | Status: AC
  Administered 2022-11-24: 30 mmol via INTRAVENOUS
  Filled 2022-11-24: qty 10

## 2022-11-24 MED ORDER — POTASSIUM CHLORIDE CRYS ER 20 MEQ PO TBCR
40.0000 meq | EXTENDED_RELEASE_TABLET | ORAL | Status: AC
  Administered 2022-11-24: 40 meq via ORAL
  Filled 2022-11-24: qty 2

## 2022-11-24 NOTE — Evaluation (Signed)
Physical Therapy Evaluation Patient Details Name: Cory Reese MRN: [id number] DOB: 1976/06/07 Today's Date: 11/24/2022  History of Present Illness  47 y/o male adm 1/24 with PMH of Alcohol Use Disorder, prior DTs who presented with nausea, vomiting, 2x hematemesis, tremors, and cough and is admitted for alcohol withdrawal.  Clinical Impression  Pt presents to PT requiring assist with all mobility due to weakness and poor balance. Pt tremulous with activity. Pt lives alone without support available and currently unable to care for himself. Recommend at SNF. However in the case of etoh withdrawal he may make a jump in progress. If that happens he may be able to return home.        Recommendations for follow up therapy are one component of a multi-disciplinary discharge planning process, led by the attending physician.  Recommendations may be updated based on patient status, additional functional criteria and insurance authorization.  Follow Up Recommendations Skilled nursing-short term rehab (<3 hours/day) Can patient physically be transported by private vehicle: Yes    Assistance Recommended at Discharge Frequent or constant Supervision/Assistance  Patient can return home with the following  A little help with walking and/or transfers;Assistance with cooking/housework;A little help with bathing/dressing/bathroom    Equipment Recommendations Rolling walker (2 wheels)  Recommendations for Other Services       Functional Status Assessment Patient has had a recent decline in their functional status and demonstrates the ability to make significant improvements in function in a reasonable and predictable amount of time.     Precautions / Restrictions Precautions Precautions: Fall Precaution Comments: droplet precautions Restrictions Weight Bearing Restrictions: No      Mobility  Bed Mobility Overal bed mobility: Needs Assistance Bed Mobility: Supine to Sit, Sit to Supine      Supine to sit: Min assist Sit to supine: Min assist   General bed mobility comments: Assist to elevate trunk into sitting and to bring legs back into bed returning to supine    Transfers Overall transfer level: Needs assistance Equipment used: Rolling walker (2 wheels) Transfers: Sit to/from Stand Sit to Stand: Mod assist           General transfer comment: Assist to bring hips up and to steady. Verbal cues for hand placement    Ambulation/Gait Ambulation/Gait assistance: Min assist Gait Distance (Feet): 30 Feet Assistive device: Rolling walker (2 wheels) Gait Pattern/deviations: Step-through pattern, Decreased step length - right, Decreased step length - left, Shuffle, Trunk flexed Gait velocity: decr Gait velocity interpretation: <1.31 ft/sec, indicative of household ambulator   General Gait Details: Assist for balance and support  Financial trader Rankin (Stroke Patients Only)       Balance Overall balance assessment: Needs assistance Sitting-balance support: No upper extremity supported, Feet unsupported Sitting balance-Leahy Scale: Good     Standing balance support: Bilateral upper extremity supported, Reliant on assistive device for balance Standing balance-Leahy Scale: Poor Standing balance comment: walker and min guard for static standing                             Pertinent Vitals/Pain Pain Assessment Pain Assessment: No/denies pain    Home Living Family/patient expects to be discharged to:: Private residence Living Arrangements: Alone   Type of Home: Apartment Home Access: Level entry       Home Layout: One level Home Equipment: Cane - single point  Prior Function Prior Level of Function : Independent/Modified Independent             Mobility Comments: has a cane but does not use it ADLs Comments: completes his own ADL and iADL.  States he still drives.     Hand Dominance    Dominant Hand: Right    Extremity/Trunk Assessment   Upper Extremity Assessment Upper Extremity Assessment: Defer to OT evaluation    Lower Extremity Assessment Lower Extremity Assessment: Generalized weakness    Cervical / Trunk Assessment Cervical / Trunk Assessment: Kyphotic  Communication   Communication: No difficulties  Cognition Arousal/Alertness: Awake/alert Behavior During Therapy: Flat affect Overall Cognitive Status: Within Functional Limits for tasks assessed                                          General Comments General comments (skin integrity, edema, etc.): HR to 150's with activity    Exercises     Assessment/Plan    PT Assessment Patient needs continued PT services  PT Problem List Decreased strength;Decreased balance;Decreased mobility;Decreased activity tolerance       PT Treatment Interventions DME instruction;Functional mobility training;Balance training;Patient/family education;Gait training;Therapeutic activities;Therapeutic exercise    PT Goals (Current goals can be found in the Care Plan section)  Acute Rehab PT Goals Patient Stated Goal: not stated PT Goal Formulation: With patient Time For Goal Achievement: 12/08/22 Potential to Achieve Goals: Good    Frequency Min 3X/week     Co-evaluation               AM-PAC PT "6 Clicks" Mobility  Outcome Measure Help needed turning from your back to your side while in a flat bed without using bedrails?: A Little Help needed moving from lying on your back to sitting on the side of a flat bed without using bedrails?: A Little Help needed moving to and from a bed to a chair (including a wheelchair)?: A Little Help needed standing up from a chair using your arms (e.g., wheelchair or bedside chair)?: A Little Help needed to walk in hospital room?: A Little Help needed climbing 3-5 steps with a railing? : A Lot 6 Click Score: 17    End of Session Equipment Utilized  During Treatment: Gait belt Activity Tolerance: Patient tolerated treatment well Patient left: in bed;with bed alarm set;with call bell/phone within reach Nurse Communication: Mobility status PT Visit Diagnosis: Unsteadiness on feet (R26.81);Muscle weakness (generalized) (M62.81)    Time: 5176-1607 PT Time Calculation (min) (ACUTE ONLY): 17 min   Charges:   PT Evaluation $PT Eval Moderate Complexity: 1 [facility] [place] 11/24/2022, 2:31 PM

## 2022-11-24 NOTE — Progress Notes (Signed)
HD#2 Subjective:   Summary: Cory Reese is a 47 y/o male with PMH of AUD, prior DTs who presented with nausea, vomiting, 2x hematemesis, tremors, and cough and is admitted for alcohol withdrawal.   Overnight Events: CIWA scores overnight ranged from 3-4.  Patient appears much improved this morning and has no new or worsening complaints other than wanting his diet advanced.  He has not had any more nausea or vomiting or frequent bowel movements.  Objective:  Vital signs in last 24 hours: Vitals:   11/23/22 2000 11/23/22 2100 11/23/22 2300 11/24/22 0300  BP: 102/70 103/72 113/83 130/88  Pulse: (!) 108 (!) 105 (!) 113 (!) 111  Resp:  18 18 16   Temp:  98.4 F (36.9 C) 99 F (37.2 C) 98.7 F (37.1 C)  TempSrc:  Oral Oral Oral  SpO2: 100% 98% 98% 98%  Weight:      Height:       Supplemental O2: Room Air SpO2: 98 % O2 Flow Rate (L/min): 1 L/min   Physical Exam:  Constitutional: middle-age male, in no acute distress Cardiovascular: Tachycardic with regular rhythm Pulmonary/Chest: normal work of breathing on room air, lungs clear to auscultation bilaterally Abdominal: soft, non-tender, non-distended MSK: normal bulk and tone Neurological: alert & oriented to person only, moves all extremities well against gravity Skin: warm and dry  Filed Weights   11/22/22 0627  Weight: 79.1 kg     Intake/Output Summary (Last 24 hours) at 11/24/2022 0602 Last data filed at 11/23/2022 2300 Gross per 24 hour  Intake --  Output 2400 ml  Net -2400 ml   Net IO Since Admission: 176.14 mL [11/24/22 0602]  Pertinent Labs:    Latest Ref Rng & Units 11/23/2022    2:55 PM 11/23/2022    2:32 AM 11/22/2022    7:31 AM  CBC  WBC 4.0 - 10.5 K/uL 8.6  7.8    Hemoglobin 13.0 - 17.0 g/dL 8.6  8.4  13.3   Hematocrit 39.0 - 52.0 % 25.4  23.5  39.0   Platelets 150 - 400 K/uL 131  128         Latest Ref Rng & Units 11/23/2022    2:32 AM 11/22/2022    6:30 PM 11/22/2022    2:34 PM  CMP   Glucose 70 - 99 mg/dL 168  154  112   BUN 6 - 20 mg/dL 18  24  26    Creatinine 0.61 - 1.24 mg/dL 0.74  0.84  1.07   Sodium 135 - 145 mmol/L 135  134  137   Potassium 3.5 - 5.1 mmol/L 2.8  2.9  3.6   Chloride 98 - 111 mmol/L 100  99  100   CO2 22 - 32 mmol/L 29  25  24    Calcium 8.9 - 10.3 mg/dL 7.5  7.5  7.2   Total Protein 6.5 - 8.1 g/dL 5.0     Total Bilirubin 0.3 - 1.2 mg/dL 1.1     Alkaline Phos 38 - 126 U/L 178     AST 15 - 41 U/L 125     ALT 0 - 44 U/L 59       Imaging: No results found.  Assessment/Plan:   Principal Problem:   Alcohol withdrawal ([place])   Patient Summary: Cory Reese is a 47 y.o. with a pertinent PMH of alcohol use disorder with previous delirium tremens who presented with nausea, vomiting, hematemesis, tremors, and cough and admitted for alcohol withdrawal.  Alcohol withdrawal Alcohol use disorder Elevated transaminases Patient is much improved this morning with lower CIWA scores overnight as well.  He is alert and oriented x 3 and after long discussion about alcohol use and cessation he plans to try and quit drinking. He is interested in naltrexone and has gone to AA before. We will try to get assistance for his naltrexone as he is concerned about the price.  - Continue Librium taper, will complete on 1/28 - Continue thiamine, folic acid, Zofran as needed  Macrocytic anemia Hematemesis Hemoglobin is again stable this morning 8.2.  He is not having any frequent bowel movements and no more nausea or vomiting.  He is still tachycardic but stable and no changes blood pressure.  We will continue to hold his Eliquis and if still stable tomorrow may restart or have him restart it with his primary care provider. - Hold Eliquis, may resume tomorrow if hemoglobin remains stable - Monitor for signs of hemodynamic instability  AKI Anion gap metabolic acidosis/lactic acidosis Hypokalemia/hypophosphatemia/hypomagnesemia Creatinine stable at 0.62.  -  Continue to monitor and ensure adequate p.o. intake - Monitor and replete potassium as needed  Dizziness Patient remains dizzy despite overall improvement in mentation.  We will consult vestibular PT for further evaluation now that he is near at baseline.  Paroxysmal atrial fibrillation On Eliquis at home which we are holding due to anemia described above.  Hypertension On clonidine at home which we are holding at the moment and will resume as necessary.  No rebound hypertension noted yet.  Tobacco use disorder - Nicotine patch  Diet:  Regular VTE: None Code: DNR PT/OT recs: Pending TOC recs: Pending  Dispo: Anticipated discharge plan pending improvement of acute withdrawal.  Johny Blamer, DO Internal Medicine Resident PGY-1 Pager: [phone]  Please contact the on call pager after 5 pm and on weekends at [phone].

## 2022-11-24 NOTE — TOC Initial Note (Signed)
Transition of Care [facility]) - Initial/Assessment Note    Patient Details  Name: Cory Reese MRN: [id number] Date of Birth: 09/27/1976  [facility]) CM/SW Contact:    Levonne Lapping, RN Phone Number: 11/24/2022, 4:17 PM  Clinical Narrative:       CM met Patient bedside.  Patient states he lives alone in [facility] and does not have any family or friend support. States he will need transportation home. CM has contacted the [facility] to let them know he was admitted.  Was told that he already had a reference # for this admission. [id number].  CM contacted [place] in [facility] April [phone]  X [phone] April stated that Patient PCP is Dr Lamont Snowball and Patients CSW is Eddie North [phone]. LVM for CSW that Patient will need DME (BSC/RW/Shower Chair) and CM will need form.   Anticipate Patient will DC to home-  TOC will continue to follow patient for any additional discharge needs       Expected Discharge Plan: Home/Self Care Barriers to Discharge: Continued Medical Work up   Patient Goals and CMS Choice Patient states their goals for this hospitalization and ongoing recovery are:: "I want to go home" CMS Medicare.gov Compare Post Acute Care list provided to:: Patient Choice offered to / list presented to : Patient [place] ownership interest in [facility].provided to::  (N/A)    Expected Discharge Plan and Services In-house Referral: NA Discharge Planning Services: CM Consult Post Acute Care Choice: Durable Medical Equipment Living arrangements for the past 2 months: Single Family Home                 DME Arranged: Bedside commode, Walker rolling, Shower stool         HH Arranged: NA          Prior Living Arrangements/Services Living arrangements for the past 2 months: Single Family Home Lives with:: Self Patient language and need for interpreter reviewed:: No Do you feel safe going back to the place where you live?: Yes      Need for  Family Participation in Patient Care: No (Comment) Care giver support system in place?: No (comment) (Patient states he has no family) Current home services:  (NONE) Criminal Activity/Legal Involvement Pertinent to Current Situation/Hospitalization: No - Comment as needed  Activities of Daily Living      Permission Sought/Granted         Permission granted to share info w AGENCY: [facility]        Emotional Assessment Appearance:: Appears older than stated age Attitude/Demeanor/Rapport: Ambitious Affect (typically observed): Restless Orientation: : Oriented to Self, Oriented to Place, Oriented to  Time, Oriented to Situation Alcohol / Substance Use: Not Applicable Psych Involvement: No (comment)  Admission diagnosis:  Alcohol withdrawal ([place]) [F10.939] Dizziness [R42] Fall, initial encounter [W19.XXXA] Alcohol withdrawal syndrome without complication ([place]) [E93.810] Gastrointestinal hemorrhage, unspecified gastrointestinal hemorrhage type [K92.2] Patient Active Problem List   Diagnosis Date Noted   Alcohol withdrawal ([place]) 11/22/2022   Seizure ([place])    Leg weakness, bilateral 04/12/2016   Seizures ([place]) 04/12/2016   Hypokalemia 04/12/2016   Hypomagnesemia 04/12/2016   Hypophosphatemia 04/12/2016   Alcohol abuse 04/12/2016   Transaminitis 04/12/2016   Anemia 04/12/2016   Thrombocytopenia ([place]) 04/12/2016   Bilateral leg weakness    Delirium tremens ([place]) 04/09/2016   History of eczema 04/08/2015   Chronic back pain 10/28/2013   PCP:  Patient, No Pcp Per Pharmacy:   [facility]  [place] [address] [place] [address] Phone: [phone] Fax: [place] [address], [place] [address] Phone: [phone] Fax: [phone]     Social Determinants of Health (SDOH) Social History: SDOH Screenings   Tobacco Use: High Risk  (01/05/2020)   SDOH Interventions:     Readmission Risk Interventions     No data to display

## 2022-11-24 NOTE — Evaluation (Signed)
Occupational Therapy Evaluation Patient Details Name: Cory Reese MRN: [id number] DOB: 1976-01-11 Today's Date: 11/24/2022   History of Present Illness 47 y/o male adm 1/24 with PMH of AUD, prior DTs who presented with nausea, vomiting, 2x hematemesis, tremors, and cough and is admitted for alcohol withdrawal.   Clinical Impression   Patient admitted for the diagnosis above.  PTA he lives alone in a ground level apartment, stating he has no family to assist him.  At baseline he was able to complete his own ADL/iADL, and walked without an AD.  Currently he is very unsteady and weak.  He needs up to Mod A for lower body ADL completion and Min A with in room mobility at RW level.  Patient has no one to assist him at home, and is a very high fall risk.  OT will follow in the acute setting to address deficits noted, with SNF recommended for post acute rehab to maximize his functional status prior to returning home.       Recommendations for follow up therapy are one component of a multi-disciplinary discharge planning process, led by the attending physician.  Recommendations may be updated based on patient status, additional functional criteria and insurance authorization.   Follow Up Recommendations  Skilled nursing-short term rehab (<3 hours/day)     Assistance Recommended at Discharge Frequent or constant Supervision/Assistance  Patient can return home with the following Assist for transportation;Assistance with cooking/housework;A lot of help with walking and/or transfers;A lot of help with bathing/dressing/bathroom    Functional Status Assessment  Patient has had a recent decline in their functional status and demonstrates the ability to make significant improvements in function in a reasonable and predictable amount of time.  Equipment Recommendations  BSC/3in1;Tub/shower bench    Recommendations for Other Services       Precautions / Restrictions Precautions Precautions:  Fall Precaution Comments: droplet precautions Restrictions Weight Bearing Restrictions: No      Mobility Bed Mobility Overal bed mobility: Needs Assistance Bed Mobility: Supine to Sit, Sit to Supine     Supine to sit: Supervision Sit to supine: Supervision        Transfers Overall transfer level: Needs assistance Equipment used: Rolling walker (2 wheels) Transfers: Sit to/from Stand, Bed to chair/wheelchair/BSC Sit to Stand: Mod assist     Step pivot transfers: Min assist            Balance Overall balance assessment: Needs assistance Sitting-balance support: Feet supported Sitting balance-Leahy Scale: Good     Standing balance support: Reliant on assistive device for balance Standing balance-Leahy Scale: Poor                             ADL either performed or assessed with clinical judgement   ADL Overall ADL's : Needs assistance/impaired Eating/Feeding: Independent;Bed level   Grooming: Wash/dry hands;Min guard;Standing   Upper Body Bathing: Set up;Sitting   Lower Body Bathing: Minimal assistance;Sit to/from stand;Moderate assistance Lower Body Bathing Details (indicate cue type and reason): mod A to stand Upper Body Dressing : Set up;Sitting   Lower Body Dressing: Minimal assistance;Moderate assistance;Sit to/from stand   Toilet Transfer: Minimal assistance;Rolling walker (2 wheels);Regular Toilet                   Vision Patient Visual Report: No change from baseline       Perception     Praxis      Pertinent Vitals/Pain Pain Assessment  Pain Assessment: No/denies pain     Hand Dominance Right   Extremity/Trunk Assessment Upper Extremity Assessment Upper Extremity Assessment: Generalized weakness   Lower Extremity Assessment Lower Extremity Assessment: Defer to PT evaluation   Cervical / Trunk Assessment Cervical / Trunk Assessment: Kyphotic   Communication Communication Communication: No difficulties    Cognition Arousal/Alertness: Awake/alert Behavior During Therapy: Flat affect Overall Cognitive Status: Within Functional Limits for tasks assessed                                       General Comments   HR 121-142 during mobility    Exercises     Shoulder Instructions      Home Living Family/patient expects to be discharged to:: Private residence Living Arrangements: Alone   Type of Home: Apartment Home Access: Level entry     Home Layout: One level     Bathroom Shower/Tub: Teacher, early years/pre: Standard Bathroom Accessibility: Yes   Home Equipment: Cane - single point          Prior Functioning/Environment Prior Level of Function : Independent/Modified Independent             Mobility Comments: has a cane but does not use it ADLs Comments: completes his own ADL and iADL.  States he still drives.        OT Problem List: Decreased strength;Decreased range of motion;Decreased activity tolerance;Impaired balance (sitting and/or standing)      OT Treatment/Interventions: Self-care/ADL training;Therapeutic activities;Balance training;Patient/family education;DME and/or AE instruction;Energy conservation    OT Goals(Current goals can be found in the care plan section) Acute Rehab OT Goals Patient Stated Goal: Get stronger and not fall OT Goal Formulation: With patient Time For Goal Achievement: 12/08/22 Potential to Achieve Goals: Good ADL Goals Pt Will Perform Grooming: standing;with set-up Pt Will Perform Lower Body Dressing: with supervision;sit to/from stand Pt Will Transfer to Toilet: with supervision;ambulating;regular height toilet Pt/caregiver will Perform Home Exercise Program: Increased strength;Both right and left upper extremity;With theraband;With Supervision  OT Frequency: Min 2X/week    Co-evaluation              AM-PAC OT "6 Clicks" Daily Activity     Outcome Measure Help from another person eating  meals?: None Help from another person taking care of personal grooming?: A Little Help from another person toileting, which includes using toliet, bedpan, or urinal?: A Lot Help from another person bathing (including washing, rinsing, drying)?: A Lot Help from another person to put on and taking off regular upper body clothing?: A Little Help from another person to put on and taking off regular lower body clothing?: A Lot 6 Click Score: 16   End of Session Equipment Utilized During Treatment: Gait belt;Rolling walker (2 wheels) Nurse Communication: Mobility status  Activity Tolerance: Patient tolerated treatment well Patient left: in bed;with call bell/phone within reach;with bed alarm set  OT Visit Diagnosis: Unsteadiness on feet (R26.81);Muscle weakness (generalized) (M62.81)                Time: 1110-1130 OT Time Calculation (min): 20 min Charges:  OT General Charges $OT Visit: 1 Visit OT Evaluation $OT Eval Moderate Complexity: 1 Mod  11/24/2022  RP, OTR/L  Acute Rehabilitation Services  Office:  [phone]   Metta Clines 11/24/2022, 11:49 AM

## 2022-11-25 DIAGNOSIS — F10939 Alcohol use, unspecified with withdrawal, unspecified: Secondary | ICD-10-CM | POA: Diagnosis not present

## 2022-11-25 LAB — CBC
HCT: 22.9 % — ABNORMAL LOW (ref 39.0–52.0)
HCT: 24.4 % — ABNORMAL LOW (ref 39.0–52.0)
Hemoglobin: 7.8 g/dL — ABNORMAL LOW (ref 13.0–17.0)
Hemoglobin: 8.2 g/dL — ABNORMAL LOW (ref 13.0–17.0)
MCH: 35.9 pg — ABNORMAL HIGH (ref 26.0–34.0)
MCH: 35.8 pg — ABNORMAL HIGH (ref 26.0–34.0)
MCHC: 34.1 g/dL (ref 30.0–36.0)
MCHC: 33.6 g/dL (ref 30.0–36.0)
MCV: 105.5 fL — ABNORMAL HIGH (ref 80.0–100.0)
MCV: 106.6 fL — ABNORMAL HIGH (ref 80.0–100.0)
Platelets: 151 10*3/uL (ref 150–400)
Platelets: 204 10*3/uL (ref 150–400)
RBC: 2.17 MIL/uL — ABNORMAL LOW (ref 4.22–5.81)
RBC: 2.29 MIL/uL — ABNORMAL LOW (ref 4.22–5.81)
RDW: 13.9 % (ref 11.5–15.5)
RDW: 13.9 % (ref 11.5–15.5)
WBC: 9.5 10*3/uL (ref 4.0–10.5)
WBC: 8.5 10*3/uL (ref 4.0–10.5)
nRBC: 0.8 % — ABNORMAL HIGH (ref 0.0–0.2)
nRBC: 0.7 % — ABNORMAL HIGH (ref 0.0–0.2)

## 2022-11-25 LAB — COMPREHENSIVE METABOLIC PANEL
ALT: 46 U/L — ABNORMAL HIGH (ref 0–44)
AST: 45 U/L — ABNORMAL HIGH (ref 15–41)
Albumin: 1.9 g/dL — ABNORMAL LOW (ref 3.5–5.0)
Alkaline Phosphatase: 147 U/L — ABNORMAL HIGH (ref 38–126)
Anion gap: 6 (ref 5–15)
BUN: 9 mg/dL (ref 6–20)
CO2: 27 mmol/L (ref 22–32)
Calcium: 7.5 mg/dL — ABNORMAL LOW (ref 8.9–10.3)
Chloride: 103 mmol/L (ref 98–111)
Creatinine, Ser: 0.61 mg/dL (ref 0.61–1.24)
GFR, Estimated: >60 mL/min (ref >60)
Glucose, Bld: 146 mg/dL — ABNORMAL HIGH (ref 70–99)
Potassium: 3.7 mmol/L (ref 3.5–5.1)
Sodium: 136 mmol/L (ref 135–145)
Total Bilirubin: 0.4 mg/dL (ref 0.3–1.2)
Total Protein: 4.7 g/dL — ABNORMAL LOW (ref 6.5–8.1)

## 2022-11-25 LAB — PHOSPHORUS: Phosphorus: 1.9 mg/dL — ABNORMAL LOW (ref 2.5–4.6)

## 2022-11-25 LAB — GLUCOSE, CAPILLARY
Glucose-Capillary: 105 mg/dL — ABNORMAL HIGH (ref 70–99)
Glucose-Capillary: 115 mg/dL — ABNORMAL HIGH (ref 70–99)
Glucose-Capillary: 134 mg/dL — ABNORMAL HIGH (ref 70–99)
Glucose-Capillary: 144 mg/dL — ABNORMAL HIGH (ref 70–99)
Glucose-Capillary: 148 mg/dL — ABNORMAL HIGH (ref 70–99)

## 2022-11-25 LAB — MAGNESIUM: Magnesium: 1.6 mg/dL — ABNORMAL LOW (ref 1.7–2.4)

## 2022-11-25 MED ORDER — MAGNESIUM SULFATE 2 GM/50ML IV SOLN
2.0000 g | Freq: Once | INTRAVENOUS | Status: AC
  Administered 2022-11-25: 2 g via INTRAVENOUS
  Filled 2022-11-25: qty 50

## 2022-11-25 MED ORDER — ENOXAPARIN SODIUM 40 MG/0.4ML IJ SOSY: 40.0000 mg | PREFILLED_SYRINGE | Freq: Every day | INTRAMUSCULAR | Status: DC

## 2022-11-25 MED ORDER — LACTATED RINGERS IV BOLUS
1000.0000 mL | Freq: Once | INTRAVENOUS | Status: AC
  Administered 2022-11-25: 1000 mL via INTRAVENOUS

## 2022-11-25 NOTE — Progress Notes (Signed)
HD#3 SUBJECTIVE:  Patient Summary: Cory Reese is a 47 y.o. with a pertinent PMH of alcohol use disorder with previous delirium tremens who presented with nausea, vomiting, hematemesis, tremors, and cough and admitted for alcohol withdrawal.    Overnight Events: None  Interim History: Patient reports that eating yesterday went well. He admits to feeling weak when working with physical therapy yesterday.  When discussing rehab, it seems he was under the impression that we were talking about alcohol-related rehab rather than PT-related rehab. He wants to get his strength back up but says home health PT would be a better option for him rather than SNF.  OBJECTIVE:  Vital Signs: Vitals:   11/24/22 1700 11/24/22 1900 11/24/22 2300 11/25/22 0500  BP:  114/70 (!) 128/90 (!) 139/96  Pulse:  (!) 123 80   Resp: 15 19 20    Temp:  98.6 F (37 C) 98.5 F (36.9 C)   TempSrc:  Oral Oral   SpO2:  (!) 86%    Weight:      Height:       Supplemental O2: Room Air SpO2: (!) 86 % O2 Flow Rate (L/min): 1 L/min  Filed Weights   11/22/22 0627  Weight: 79.1 kg     Intake/Output Summary (Last 24 hours) at 11/25/2022 0650 Last data filed at 11/24/2022 1625 Gross per 24 hour  Intake 326.12 ml  Output 1100 ml  Net -773.88 ml   Net IO Since Admission: -597.74 mL [11/25/22 0650]  Physical Exam: Physical Exam Constitutional:      General: He is not in acute distress.    Appearance: He is ill-appearing (Chronically).  Cardiovascular:     Rate and Rhythm: Tachycardia present.  Pulmonary:     Effort: Pulmonary effort is normal.     Breath sounds: Normal breath sounds.  Abdominal:     General: Bowel sounds are normal. There is no distension.     Tenderness: There is no abdominal tenderness.  Musculoskeletal:     Right lower leg: No edema.     Left lower leg: No edema.  Skin:    General: Skin is warm and dry.  Neurological:     Mental Status: He is alert. Mental status is at baseline.   Psychiatric:     Comments: Anxious-appearing    Patient Lines/Drains/Airways Status     Active Line/Drains/Airways     Name Placement date Placement time Site Days   Peripheral IV 11/24/22 22 G 1.75" Left;Anterior Forearm 11/24/22  2257  Forearm  1   External Urinary Catheter 11/24/22  0600  --  1             ASSESSMENT/PLAN:  Assessment: Principal Problem:   Alcohol withdrawal ([place])  Plan:  Alcohol withdrawal Alcohol use disorder Elevated transaminases Patient is tremulous and tachycardic but CIWA scores have been low over last 24h. He has tolerated progressing his diet well. He remains on librium taper. Liver enzymes remain elevated but are improving. -Continue Librium taper, will complete on 1/28 -Continue thiamine, folic acid -Zofran as needed   Macrocytic anemia Hematemesis Patient has macrocytic anemia which has slightly worsened overnight with Hgb 7.8 and hematocrit 22.9 (8.2/23.7). He is tachycardic to 120s-130s; BP remains stable. No obvious sign of acute bleed at this time. -Recheck CBC this evening to ensure stability of hemoglobin; transfuse if <7 and consider GI consult -Continue to hold eliquis -Continue protonix 40 mg daily  Tachycardia Likely multifactorial in setting of alcohol withdrawal, severe anemia, history  of atrial fibrillation though rate is sinus tachycardia at this time. -Consider addition of beta blocker in future if tachycardia persists despite resolution of acute ailments   AKI, resolved Anion gap metabolic acidosis, resolved Electrolyte derangements, multiple Renal function stable. -Encourage PO intake -Trend CMP   Dizziness Patient remains dizzy despite overall improvement in mentation.  We will consult vestibular PT for further evaluation now that he is near at baseline.   Paroxysmal atrial fibrillation Home eliquis held at this time in setting of severe anemia. Rhythm is sinus tachycardia.    Hypertension BP stable without  severe HTN. Holding home clonidine.   Tobacco use disorder -Nicotine patch  Best Practice: Diet: Regular diet IVF: None Code: Full AB: None Therapy Recs: SNF DISPO: Anticipated discharge tomorrow to Home.  Signature: Farrel Gordon, D.O.  Internal Medicine Resident, PGY-2 [facility]  Pager: [phone]   Please contact the on call pager after 5 pm and on weekends at [phone].

## 2022-11-25 NOTE — Progress Notes (Signed)
Cory Reese is a 47yo male with Hx of alcohol use disorder c/b hx of DT who was admitted for hematemesis and alcohol withdrawal currently on librium. Night team received sign out that patient has been with tachycardia >120. Patient was evaluated at bedside  Patient was resting in bed, awake. He denied chest pain, shortness of breath, changes in vision, HA. He denied new hematemesis episodes since admission. Has not passed BM, thus unable to assess if stool with melena or hematochezia signs. No blood in urine.    He has been eating some of his meals, and has sipped from ~2 cups of water, but reports he has not been drinking much. He has a condom cath, and mentioned bag was emptied earlier.  Denies pain, anxiety. Reported tremor is chronic for him. Not worsened per patient  Vitals:   11/25/22 1600 11/25/22 2028  BP: 122/79 132/84  Pulse: (!) 127 (!) 121  Resp: 17 16  Temp: 98.2 F (36.8 C) 98.4 F (36.9 C)  SpO2: 95% 100%   Alert and oriented, no distress dry mucus membranes [place] CTAB decreased skin turgor  Telemetry with regular rhythm with HR 120 consistent with supraventricular tachycardia Urine color -  light yellow 1/27 6PM CBC hgb 8.2 mCV 106 PLT 204  A:  Tachycardia Likely multifactorial: anemia vs hypovolemia 2/2 low po intake vs alchohol withdrawal. Patient's hgb has improved and has been on Librium taper with low CIWA, which decreases my clinical suspicion for acute blood loss or etoh withdrawal worsening at this time. Upon chart review, pt presented with tachycardia that transiently improved after 2L fluid bolus. Tonight, he appears volume depleted on exam, thus will empirically trial 1L IVF over 2 hours and reassess.  - 1L LR bolus over 2 HR -Encouraged PO intake  Romana Juniper, MD [facility] Internal Medicine Program - PGY-1 11/25/22

## 2022-11-26 DIAGNOSIS — F10939 Alcohol use, unspecified with withdrawal, unspecified: Secondary | ICD-10-CM | POA: Diagnosis not present

## 2022-11-26 LAB — CBC
HCT: 23.3 % — ABNORMAL LOW (ref 39.0–52.0)
Hemoglobin: 8 g/dL — ABNORMAL LOW (ref 13.0–17.0)
MCH: 36.2 pg — ABNORMAL HIGH (ref 26.0–34.0)
MCHC: 34.3 g/dL (ref 30.0–36.0)
MCV: 105.4 fL — ABNORMAL HIGH (ref 80.0–100.0)
Platelets: 219 10*3/uL (ref 150–400)
RBC: 2.21 MIL/uL — ABNORMAL LOW (ref 4.22–5.81)
RDW: 13.9 % (ref 11.5–15.5)
WBC: 9.4 10*3/uL (ref 4.0–10.5)
nRBC: 0.4 % — ABNORMAL HIGH (ref 0.0–0.2)

## 2022-11-26 LAB — GLUCOSE, CAPILLARY
Glucose-Capillary: 105 mg/dL — ABNORMAL HIGH (ref 70–99)
Glucose-Capillary: 121 mg/dL — ABNORMAL HIGH (ref 70–99)
Glucose-Capillary: 126 mg/dL — ABNORMAL HIGH (ref 70–99)
Glucose-Capillary: 128 mg/dL — ABNORMAL HIGH (ref 70–99)
Glucose-Capillary: 137 mg/dL — ABNORMAL HIGH (ref 70–99)
Glucose-Capillary: 154 mg/dL — ABNORMAL HIGH (ref 70–99)
Glucose-Capillary: 95 mg/dL (ref 70–99)

## 2022-11-26 LAB — COMPREHENSIVE METABOLIC PANEL
ALT: 39 U/L (ref 0–44)
AST: 34 U/L (ref 15–41)
Albumin: 1.9 g/dL — ABNORMAL LOW (ref 3.5–5.0)
Alkaline Phosphatase: 130 U/L — ABNORMAL HIGH (ref 38–126)
Anion gap: 8 (ref 5–15)
BUN: 7 mg/dL (ref 6–20)
CO2: 28 mmol/L (ref 22–32)
Calcium: 7.9 mg/dL — ABNORMAL LOW (ref 8.9–10.3)
Chloride: 101 mmol/L (ref 98–111)
Creatinine, Ser: 0.62 mg/dL (ref 0.61–1.24)
GFR, Estimated: >60 mL/min (ref >60)
Glucose, Bld: 95 mg/dL (ref 70–99)
Potassium: 3.3 mmol/L — ABNORMAL LOW (ref 3.5–5.1)
Sodium: 137 mmol/L (ref 135–145)
Total Bilirubin: 0.4 mg/dL (ref 0.3–1.2)
Total Protein: 4.9 g/dL — ABNORMAL LOW (ref 6.5–8.1)

## 2022-11-26 LAB — PHOSPHORUS: Phosphorus: 2.3 mg/dL — ABNORMAL LOW (ref 2.5–4.6)

## 2022-11-26 LAB — MAGNESIUM: Magnesium: 1.5 mg/dL — ABNORMAL LOW (ref 1.7–2.4)

## 2022-11-26 MED ORDER — PANTOPRAZOLE SODIUM 40 MG PO TBEC
40.0000 mg | DELAYED_RELEASE_TABLET | Freq: Every day | ORAL | 0 refills | Status: AC
  Filled 2022-11-26: qty 30, 30d supply, fill #0

## 2022-11-26 MED ORDER — POTASSIUM CHLORIDE 10 MEQ/100ML IV SOLN
10.0000 meq | INTRAVENOUS | Status: AC
  Administered 2022-11-26 (×4): 10 meq via INTRAVENOUS
  Filled 2022-11-26 (×4): qty 100

## 2022-11-26 MED ORDER — FOLIC ACID 1 MG PO TABS
1.0000 mg | ORAL_TABLET | Freq: Every day | ORAL | 3 refills | Status: AC
  Filled 2022-11-26: qty 90, 90d supply, fill #0

## 2022-11-26 MED ORDER — NICOTINE 14 MG/24HR TD PT24
14.0000 mg | MEDICATED_PATCH | Freq: Every day | TRANSDERMAL | 0 refills | Status: AC
  Filled 2022-11-26: qty 28, 28d supply, fill #0

## 2022-11-26 MED ORDER — LACTATED RINGERS IV BOLUS
1000.0000 mL | Freq: Once | INTRAVENOUS | Status: AC
  Administered 2022-11-26: 1000 mL via INTRAVENOUS

## 2022-11-26 MED ORDER — ADULT MULTIVITAMIN W/MINERALS CH
1.0000 | ORAL_TABLET | Freq: Every day | ORAL | 3 refills | Status: AC
  Filled 2022-11-26: qty 90, 90d supply, fill #0

## 2022-11-26 MED ORDER — MAGNESIUM SULFATE 2 GM/50ML IV SOLN
2.0000 g | Freq: Once | INTRAVENOUS | Status: AC
  Administered 2022-11-26: 2 g via INTRAVENOUS
  Filled 2022-11-26: qty 50

## 2022-11-26 MED ORDER — POTASSIUM PHOSPHATES 15 MMOLE/5ML IV SOLN
30.0000 mmol | Freq: Once | INTRAVENOUS | Status: AC
  Administered 2022-11-26: 30 mmol via INTRAVENOUS
  Filled 2022-11-26: qty 10

## 2022-11-26 NOTE — Discharge Summary (Signed)
Name: TUSHAR ENNS MRN: [id number] DOB: 1975/11/21 47 y.o. PCP: Patient, No Pcp Per  Date of Admission: 11/22/2022  6:20 AM Date of Discharge: 11/26/2022 Attending Physician: Dr.  Ninetta Lights  Discharge Diagnosis: Principal Problem:   Alcohol withdrawal [facility])    Discharge Medications: Allergies as of 11/26/2022       Reactions   Doxycycline Rash        Medication List     STOP taking these medications    cloNIDine 0.1 MG tablet Commonly known as: CATAPRES       TAKE these medications    clobetasol 0.05 % external solution Commonly known as: TEMOVATE APPLY 1 APPLICATION TOPICALLY 2 TIMES DAILY.   folic acid 1 MG tablet Commonly known as: FOLVITE Take 1 tablet (1 mg total) by mouth daily. Start taking on: November 27, 2022   HYDROcodone-acetaminophen 5-325 MG tablet Commonly known as: NORCO/VICODIN Take 1 tablet by mouth every 6 (six) hours as needed for moderate pain.   multivitamin with minerals Tabs tablet Take 1 tablet by mouth daily. Start taking on: November 27, 2022   nicotine 14 mg/24hr patch Commonly known as: NICODERM CQ - dosed in mg/24 hours Place 1 patch (14 mg total) onto the skin daily. Start taking on: November 27, 2022   pantoprazole 40 MG tablet Commonly known as: PROTONIX Take 1 tablet (40 mg total) by mouth daily. Start taking on: November 27, 2022        Disposition and follow-up:   Mr.Bowen DEMETRY BENDICKSON was discharged from [facility] in Stable condition.  At the hospital follow up visit please address:  1.  Follow-up:  a.  Follow-up on anemia and assess for any signs of GI blood loss.  Repeat CBC and if still having macrocytic anemia he would need further evaluation with vitamin B12, folate, and iron studies.  If stable or improved consider restarting Eliquis.    b.  Follow-up on alcohol cessation as patient expressed desire to pursue AA and he was prescribed naltrexone on discharge.   c.  Reevaluate patient's  tachycardia, if persistent without persistent anemia or signs of hypovolemia would recommend repeat EKG with his history of A-fib.   d.  Reassess dizziness as we considered involving vestibular PT during admission but thought his dizziness was more likely associated with his acute presentation.  2.  Labs / imaging needed at time of follow-up: CBC, CMP  3.  Pending labs/ test needing follow-up: None  Follow-up Appointments: Patient will be contacted for follow-up visit in the [facility].  Hospital Course by problem list: ILIAN WESSELL is a 47 y.o. with a pertinent PMH of alcohol use disorder with previous delirium tremens who presented with nausea, vomiting, hematemesis, tremors, and cough and admitted for alcohol withdrawal.      Alcohol withdrawal Alcohol use disorder Presented on 11/22/2022 with nausea, vomiting, hematemesis, tremors, and cough.  Last drink 1/23.  On initial exam he was tachycardic and disoriented.  He was started on CIWA with Ativan as well as thiamine, folic acid, and Zofran as needed.  Initially his CIWA scores were quite high and he was started on a Librium taper at that time.  After about 24 hours in the hospital he became less disoriented.  We discussed alcohol cessation and naltrexone which he was interested in.  Other than his persistent tachycardia described below he remained stable throughout admission on the Librium taper and did not have any other signs of withdrawal return after completing it.  He was discharged with naltrexone and will be set up with a follow-up appointment in our clinic.   Persistent tachycardia Patient was persistently tachycardic during his admission with rates predominantly from 100-130.  This was thought to be most likely secondary to his ongoing alcohol withdrawal, poor p.o. intake, anemia, and physical deconditioning.  He worked with physical therapy who recommended SNF but patient strongly preferred home health.  He did respond partially to IV  fluids and was encouraged to have good p.o. intake.  I think he should have a follow-up EKG after a period of good p.o. intake and increased activity.  If still persistently tachycardic and without significant anemia this may warrant further evaluation.  Macrocytic anemia Hematemesis Patient presented after 2 episodes of blood-streaked emesis.  Hemoglobin was initially 11.7 but then dropped to 8.4 following morning and remained stable there during the rest of the admission.  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  His Eliquis was held during admission and if still anemic outside of the hospital he should have further workup with vitamin B12, folate, and iron studies.  Elevated transaminases AST and ALT were elevated at 125 and 59 on admission with T. bili of 1.1 and alkaline phosphatase of 178.  AST and ALT normalized during admission and T. bili decreased to 0.4.  Alkaline phosphatase remains elevated at 130.  Most likely due to mild alcoholic hepatitis.  AKI Anion gap metabolic acidosis/lactic acidosis Hypokalemia/hypophosphatemia/hypomagnesemia Presented with creatinine of 1.30 up from baseline 0.7-0.8.  Patient is lactic acid 5.0 on admission which increased to 5.5 but then eventually decreased to 2.6.  After IV fluid rehydration his lactic acidosis improved and creatinine remained stable at baseline and was 0.62 on day of discharge.  He also had recurrent hypokalemia down to 2.8 despite daily supplementation.  We will send him out with potassium chloride 20 mill equivalents daily and recommend follow-up labs to either increase or discontinue supplementation.   Dizziness On admission patient reported some dizziness recently.  We are most concerned that his centrally acting medications and alcohol use were likely contributory.  His dizziness did not initially improve as his  mental status improved but he did not complain of any dizziness on his day of discharge.    Paroxysmal atrial fibrillation Stable during admission with tachycardia described above but no atrial fibrillation.  On Eliquis at home which held due to anemia and if his blood counts are stable or improved on follow-up he may restart this.   Hypertension On clonidine at home which we held during admission as he stated he does not using it anyway.  He did not have any notable rebound hypertension.  This was discontinued on discharge.   Tobacco use disorder During admission we encouraged tobacco cessation and discharged him with a prescription for nicotine patches.   Discharge Subjective: Patient had some nausea this morning but is otherwise doing well.  He has been able to eat and drink adequately.    Discharge Exam:   BP (!) 132/104 (BP Location: Right Arm)   Pulse (!) 117   Temp 98 F (36.7 C) (Oral)   Resp 17   Ht 6\' 2"  (1.88 m)   Wt 79.1 kg   SpO2 95%   BMI 22.39 kg/m  Constitutional: middle-age male, in no acute distress Cardiovascular: Tachycardic with regular rhythm Pulmonary/Chest: normal work of breathing on room air,  lungs clear to auscultation bilaterally Abdominal: soft, non-tender, non-distended MSK: normal bulk and tone Neurological: alert & oriented x3, moves all extremities well against gravity Skin: warm and dry  Pertinent Labs, Studies, and Procedures:     Latest Ref Rng & Units 11/26/2022    2:21 AM 11/25/2022    6:14 PM 11/25/2022    2:50 AM  CBC  WBC 4.0 - 10.5 K/uL 9.4  8.5  9.5   Hemoglobin 13.0 - 17.0 g/dL 8.0  8.2  7.8   Hematocrit 39.0 - 52.0 % 23.3  24.4  22.9   Platelets 150 - 400 K/uL 219  204  151        Latest Ref Rng & Units 11/26/2022    2:21 AM 11/25/2022    2:50 AM 11/24/2022    6:52 AM  CMP  Glucose 70 - 99 mg/dL 95  146  179   BUN 6 - 20 mg/dL 7  9  <5   Creatinine 0.61 - 1.24 mg/dL 0.62  0.61  0.62   Sodium 135 - 145 mmol/L 137  136  136    Potassium 3.5 - 5.1 mmol/L 3.3  3.7  3.2   Chloride 98 - 111 mmol/L 101  103  99   CO2 22 - 32 mmol/L 28  27  29    Calcium 8.9 - 10.3 mg/dL 7.9  7.5  7.5   Total Protein 6.5 - 8.1 g/dL 4.9  4.7  5.0   Total Bilirubin 0.3 - 1.2 mg/dL 0.4  0.4  0.9   Alkaline Phos 38 - 126 U/L 130  147  154   AST 15 - 41 U/L 34  45  82   ALT 0 - 44 U/L 39  46  57     DG Pelvis 1-2 Views  Result Date: 11/22/2022 CLINICAL DATA:  Fall pain EXAM: PELVIS - 1 VIEW; LEFT FEMUR 2 VIEWS COMPARISON:  09/10/2022 FINDINGS: No fracture, dislocation or subluxation identified. Heterotopic ossification about the left hip is consistent with old trauma. No osteolytic or osteoblastic changes. IMPRESSION: No acute osseous abnormalities. Electronically Signed   By: Sammie Bench M.D.   On: 11/22/2022 08:44   DG Femur Min 2 Views Left  Result Date: 11/22/2022 CLINICAL DATA:  Fall pain EXAM: PELVIS - 1 VIEW; LEFT FEMUR 2 VIEWS COMPARISON:  09/10/2022 FINDINGS: No fracture, dislocation or subluxation identified. Heterotopic ossification about the left hip is consistent with old trauma. No osteolytic or osteoblastic changes. IMPRESSION: No acute osseous abnormalities. Electronically Signed   By: Sammie Bench M.D.   On: 11/22/2022 08:44   CT Maxillofacial WO CM  Result Date: 11/22/2022 CLINICAL DATA:  47 year old male with dizziness and fall. EXAM: CT MAXILLOFACIAL WITHOUT CONTRAST TECHNIQUE: Multidetector CT imaging of the maxillofacial structures was performed. Multiplanar CT image reconstructions were also generated. RADIATION DOSE REDUCTION: This exam was performed according to the departmental dose-optimization program which includes automated exposure control, adjustment of the mA and/or kV according to patient size and/or use of iterative reconstruction technique. COMPARISON:  Head CT today. FINDINGS: Osseous: Mandible intact and normally located. Crowding of mandible incisor dentition. No acute dental finding identified.  Maxilla, zygoma, pterygoid, and nasal bones appear intact. Central skull base appears intact. Visible cervical vertebrae appear intact and aligned. Orbits: Intact orbital walls. Orbits soft tissues appears symmetric and normal. Sinuses: Clear throughout. Soft tissues: Negative visible noncontrast supraglottic larynx, pharynx, parapharyngeal spaces, retropharyngeal space, sublingual space, submandibular spaces, masticator and parotid spaces. No superficial soft tissue  injury identified. No upper cervical lymphadenopathy. Limited intracranial: Stable to that reported separately today. IMPRESSION: No acute traumatic injury identified in the Face. Electronically Signed   By: Odessa Fleming M.D.   On: 11/22/2022 08:03   CT Head Wo Contrast  Result Date: 11/22/2022 CLINICAL DATA:  47 year old male with dizziness and fall. History of atrial fibrillation and right basal ganglia infarct in 2021. EXAM: CT HEAD WITHOUT CONTRAST TECHNIQUE: Contiguous axial images were obtained from the base of the skull through the vertex without intravenous contrast. RADIATION DOSE REDUCTION: This exam was performed according to the departmental dose-optimization program which includes automated exposure control, adjustment of the mA and/or kV according to patient size and/or use of iterative reconstruction technique. COMPARISON:  Head CT 05/23/2020.  CTA head and neck 05/25/2020. FINDINGS: Brain: Stable cerebral volume. No midline shift, ventriculomegaly, mass effect, evidence of mass lesion, intracranial hemorrhage or evidence of cortically based acute infarction. Subtle encephalomalacia in the right lentiform corresponding to 2021 ischemia. Gray-white matter differentiation elsewhere appears stable and within normal limits. Vascular: No suspicious intracranial vascular hyperdensity. Skull: No acute osseous abnormality identified. Sinuses/Orbits: Visualized paranasal sinuses and mastoids are stable and well aerated. Other: No acute orbit or  scalp soft tissue injury identified. IMPRESSION: 1. No acute intracranial abnormality or acute traumatic injury identified. 2. Subtle encephalomalacia in the right lentiform corresponding to 2021 ischemia. Electronically Signed   By: Odessa Fleming M.D.   On: 11/22/2022 07:59   DG Chest Port 1 View  Result Date: 11/22/2022 CLINICAL DATA:  Weakness and dizziness.  Frequent falls. EXAM: PORTABLE CHEST 1 VIEW COMPARISON:  Portable chest 06/14/2022. FINDINGS: The cardiomediastinal silhouette and vascular pattern are normal. The lungs are mildly emphysematous but clear. No pleural effusion is seen. No acute osseous findings or visible displaced rib fractures. There are multiple overlying monitor wires. IMPRESSION: No acute radiographic chest findings.  Stable COPD chest. Electronically Signed   By: Almira Bar M.D.   On: 11/22/2022 07:49     Discharge Instructions: Discharge Instructions     Call MD for:  difficulty breathing, headache or visual disturbances   Complete by: As directed    Call MD for:  extreme fatigue   Complete by: As directed    Call MD for:  persistant dizziness or light-headedness   Complete by: As directed    Call MD for:  persistant nausea and vomiting   Complete by: As directed    Call MD for:  severe uncontrolled pain   Complete by: As directed    Call MD for:  temperature >100.4   Complete by: As directed    Diet - low sodium heart healthy   Complete by: As directed    Increase activity slowly   Complete by: As directed        Signed: Rocky Morel, DO 11/26/2022, 5:41 PM   Pager: [phone]

## 2022-11-26 NOTE — Progress Notes (Signed)
Pt's potassium phosphate is still infusing. Will discharge pt after completion of medication around 1945.

## 2022-11-26 NOTE — Discharge Instructions (Addendum)
Cory Reese,  You were recently admitted to [facility] for alcohol withdrawal and anemia.   Continue taking your home medications with the following changes  Start taking Naltrexone 50 mg daily Potassium 20 mEq daily Daily multivitamin Folic acid 1 mg daily Thiamine 100 mg daily Pantoprazole 40 mg daily Stop taking Clonidine  You should seek further medical care if have any repeat symptoms including bloody vomiting, nausea and vomiting that is not improving, dark or tarry stools, blood in your stool.  We recommend that you see your primary care doctor in about a week to make sure that you continue to improve. We are so glad that you are feeling better.  Sincerely, Johny Blamer, DO

## 2022-11-26 NOTE — Progress Notes (Signed)
Reviewed discharge paperwork with pt. Pt stated he does not have transportation and was not aware he'd be getting discharged tonight and will prefer to wait until tomorrow morning, also that he still does not feel well and feel comfortable being discharged. Dr. Nikki Dom made aware.   Called Hassan Rowan RN with TOC and she stated to contact the [facility] to get a taxi voucher. Will make pt aware that transport will be provided.

## 2022-11-26 NOTE — Progress Notes (Signed)
HD#4 Subjective:   Summary: Cory Reese is a 47 y/o male with PMH of AUD, prior DTs who presented with nausea, vomiting, 2x hematemesis, tremors, and cough and is admitted for alcohol withdrawal.   Overnight Events: Had a run of SVT and was given a liter of LR with benefit.   Patient had some nausea this morning but is otherwise doing well.  He has been able to eat and drink adequately.   Objective:  Vital signs in last 24 hours: Vitals:   11/26/22 0031 11/26/22 0123 11/26/22 0300 11/26/22 0418  BP: (!) 148/101 (!) 146/99  (!) 137/94  Pulse: (!) 127 (!) 111 (!) 114 (!) 114  Resp: (!) 23 12 17 18   Temp: 98.8 F (37.1 C)   97.9 F (36.6 C)  TempSrc: Oral   Oral  SpO2: 97% 97% 98% 98%  Weight:      Height:       Supplemental O2: Room Air SpO2: 98 % O2 Flow Rate (L/min): 1 L/min   Physical Exam:  Constitutional: middle-age male, in no acute distress Cardiovascular: Tachycardic with regular rhythm Pulmonary/Chest: normal work of breathing on room air, lungs clear to auscultation bilaterally Abdominal: soft, non-tender, non-distended MSK: normal bulk and tone Neurological: alert & oriented x3, moves all extremities well against gravity Skin: warm and dry  Filed Weights   11/22/22 0627  Weight: 79.1 kg     Intake/Output Summary (Last 24 hours) at 11/26/2022 0713 Last data filed at 11/26/2022 0421 Gross per 24 hour  Intake 50 ml  Output 4300 ml  Net -4250 ml   Net IO Since Admission: -4,847.74 mL [11/26/22 0713]  Pertinent Labs:    Latest Ref Rng & Units 11/26/2022    2:21 AM 11/25/2022    6:14 PM 11/25/2022    2:50 AM  CBC  WBC 4.0 - 10.5 K/uL 9.4  8.5  9.5   Hemoglobin 13.0 - 17.0 g/dL 8.0  8.2  7.8   Hematocrit 39.0 - 52.0 % 23.3  24.4  22.9   Platelets 150 - 400 K/uL 219  204  151        Latest Ref Rng & Units 11/26/2022    2:21 AM 11/25/2022    2:50 AM 11/24/2022    6:52 AM  CMP  Glucose 70 - 99 mg/dL 95  146  179   BUN 6 - 20 mg/dL 7  9  <5    Creatinine 0.61 - 1.24 mg/dL 0.62  0.61  0.62   Sodium 135 - 145 mmol/L 137  136  136   Potassium 3.5 - 5.1 mmol/L 3.3  3.7  3.2   Chloride 98 - 111 mmol/L 101  103  99   CO2 22 - 32 mmol/L 28  27  29    Calcium 8.9 - 10.3 mg/dL 7.9  7.5  7.5   Total Protein 6.5 - 8.1 g/dL 4.9  4.7  5.0   Total Bilirubin 0.3 - 1.2 mg/dL 0.4  0.4  0.9   Alkaline Phos 38 - 126 U/L 130  147  154   AST 15 - 41 U/L 34  45  82   ALT 0 - 44 U/L 39  46  57     Imaging: No results found.  Assessment/Plan:   Principal Problem:   Alcohol withdrawal ([place])   Patient Summary: Cory Reese is a 47 y.o. with a pertinent PMH of alcohol use disorder with previous delirium tremens who presented with nausea, vomiting,  hematemesis, tremors, and cough and admitted for alcohol withdrawal.    Alcohol withdrawal Alcohol use disorder Elevated transaminases Last drink 1/23.  Completed Librium taper this morning. - Start naltrexone - Continue thiamine, folic acid, Zofran as needed  Persistent tachycardia Patient has been tachycardic with rates from 100s to the 120s since admission.  Most likely secondary to dehydration and anemia as below.  Also exacerbated by alcohol withdrawal.  He is eating and drinking well now. - Repeat 1 L LR today, if responds well he may go home today  Macrocytic anemia Hematemesis Hemoglobin is again stable this morning 8.0.  Remains tachycardic but no signs of ongoing bleeding.  We are continue to hold his Eliquis and we will have him restart it with his primary care provider. - Hold Eliquis - Monitor for signs of hemodynamic instability  AKI Anion gap metabolic acidosis/lactic acidosis Hypokalemia/hypophosphatemia/hypomagnesemia Creatinine stable at 0.62.  - Continue to monitor and ensure adequate p.o. intake - Monitor and replete electrolytes as needed  Dizziness Patient remains dizzy despite overall improvement in mentation, may be exacerbated by dehydration and anemia.  We  will consult vestibular PT if he stays another night.  Paroxysmal atrial fibrillation On Eliquis at home which we are holding due to anemia described above.  Hypertension On clonidine at home which we are holding at the moment and will resume as necessary.  No rebound hypertension noted yet.  Tobacco use disorder - Nicotine patch  Diet:  Regular VTE: None Code: DNR PT/OT recs: PT/OT recommend SNF however patient would prefer home health instead  Dispo: Anticipated discharge today or tomorrow home with home health PT/OT.  Cory Blamer, DO Internal Medicine Resident PGY-1 Pager: [phone]  Please contact the on call pager after 5 pm and on weekends at [phone].

## 2022-11-27 ENCOUNTER — Other Ambulatory Visit: Payer: Self-pay

## 2022-11-27 LAB — GLUCOSE, CAPILLARY
Glucose-Capillary: 102 mg/dL — ABNORMAL HIGH (ref 70–99)
Glucose-Capillary: 136 mg/dL — ABNORMAL HIGH (ref 70–99)

## 2022-11-27 MED ORDER — NALTREXONE HCL 50 MG PO TABS
50.0000 mg | ORAL_TABLET | Freq: Every day | ORAL | 2 refills | Status: AC
  Filled 2022-11-27: qty 30, 30d supply, fill #0

## 2022-11-27 NOTE — Progress Notes (Addendum)
Discharge instructions given. Patient verbalized understanding and all questions were answered.   Patient refused DME.

## 2022-11-27 NOTE — Progress Notes (Signed)
Patient adamant on wanting to go home without proper DME being delivered to bedside before leaving unit. Addressed with internal medicine team patient's heart rate remains tachy (110s-130s), no further orders. Awaiting cab voucher before leaving unit. Answered all questions/concerns. Patient stable at this time.

## 2022-11-27 NOTE — TOC Transition Note (Signed)
Transition of Care [facility]) - CM/SW Discharge Note   Patient Details  Name: Cory Reese MRN: [id number] Date of Birth: 12/28/1975  [facility]) CM/SW Contact:  Levonne Lapping, RN Phone Number: 11/27/2022, 12:08 PM   Clinical Narrative:    Patient insisted on leaving before DME was delivered.  Being arranged thru the [place]. Home Health will also be arranged. Alvis Lemmings has accepted for HHPT.  [facility] provided. No additional TOC needs      Barriers to Discharge: Continued Medical Work up   Patient Goals and CMS Choice CMS Medicare.gov Compare Post Acute Care list provided to:: Patient Choice offered to / list presented to : Patient  Discharge Placement                         Discharge Plan and Services Additional resources added to the After Visit Summary for   In-house Referral: NA Discharge Planning Services: CM Consult Post Acute Care Choice: Durable Medical Equipment          DME Arranged: Bedside commode, Walker rolling, Shower stool         HH Arranged: NA          Social Determinants of Health (SDOH) Interventions SDOH Screenings   Tobacco Use: High Risk (01/05/2020)     Readmission Risk Interventions     No data to display

## 2022-12-04 ENCOUNTER — Other Ambulatory Visit: Payer: Self-pay

## 2022-12-07 ENCOUNTER — Encounter: Payer: No Typology Code available for payment source | Admitting: Student
